# Patient Record
Sex: Female | Born: 1980 | Race: White | Hispanic: No | Marital: Married | Smoking: Never smoker
Health system: Southern US, Community
[De-identification: ages and names within clinical notes are randomized; demographics above are authoritative.]

## PROBLEM LIST (undated history)

## (undated) DIAGNOSIS — N3010 Interstitial cystitis (chronic) without hematuria: Secondary | ICD-10-CM

## (undated) DIAGNOSIS — E538 Deficiency of other specified B group vitamins: Secondary | ICD-10-CM

## (undated) HISTORY — PX: TUBAL LIGATION: SHX77

---

## 2014-04-01 ENCOUNTER — Inpatient Hospital Stay: Payer: Self-pay | Admitting: Psychiatry

## 2014-04-01 LAB — URINALYSIS, COMPLETE
Bacteria: NONE SEEN
Bilirubin,UR: NEGATIVE
Glucose,UR: NEGATIVE mg/dL (ref 0–75)
Nitrite: NEGATIVE
Ph: 6 (ref 4.5–8.0)
Protein: 30
RBC,UR: 114 /HPF (ref 0–5)
SPECIFIC GRAVITY: 1.024 (ref 1.003–1.030)
Squamous Epithelial: 1
Transitional Epi: 5
WBC UR: 49 /HPF (ref 0–5)

## 2014-04-01 LAB — COMPREHENSIVE METABOLIC PANEL
ALBUMIN: 4 g/dL (ref 3.4–5.0)
ALK PHOS: 119 U/L — AB
ALT: 35 U/L
Anion Gap: 8 (ref 7–16)
BILIRUBIN TOTAL: 0.8 mg/dL (ref 0.2–1.0)
BUN: 10 mg/dL (ref 7–18)
CALCIUM: 8.6 mg/dL (ref 8.5–10.1)
CHLORIDE: 106 mmol/L (ref 98–107)
Co2: 25 mmol/L (ref 21–32)
Creatinine: 0.75 mg/dL (ref 0.60–1.30)
EGFR (African American): >60
Glucose: 78 mg/dL (ref 65–99)
OSMOLALITY: 275 (ref 275–301)
Potassium: 3.6 mmol/L (ref 3.5–5.1)
SGOT(AST): 22 U/L (ref 15–37)
Sodium: 139 mmol/L (ref 136–145)
Total Protein: 7.4 g/dL (ref 6.4–8.2)

## 2014-04-01 LAB — CBC
HCT: 46.5 % (ref 35.0–47.0)
HGB: 15.4 g/dL (ref 12.0–16.0)
MCH: 31.2 pg (ref 26.0–34.0)
MCHC: 33.1 g/dL (ref 32.0–36.0)
MCV: 94 fL (ref 80–100)
PLATELETS: 383 10*3/uL (ref 150–440)
RBC: 4.94 10*6/uL (ref 3.80–5.20)
RDW: 14.2 % (ref 11.5–14.5)
WBC: 9 10*3/uL (ref 3.6–11.0)

## 2014-04-01 LAB — ACETAMINOPHEN LEVEL

## 2014-04-01 LAB — PREGNANCY, URINE: Pregnancy Test, Urine: NEGATIVE m[IU]/mL

## 2014-04-01 LAB — DRUG SCREEN, URINE

## 2014-04-01 LAB — SALICYLATE LEVEL: Salicylates, Serum: <1.7 mg/dL

## 2014-04-01 LAB — ETHANOL: Ethanol: <3 mg/dL

## 2014-04-06 LAB — URINE CULTURE

## 2014-10-19 NOTE — Consult Note (Signed)
PATIENT NAME:  Tracy Rice, Meda N MR#:  [id number] DATE OF BIRTH:  12-Sep-1980  DATE OF CONSULTATION:  04/01/2014  REFERRING PHYSICIAN:   CONSULTING PHYSICIAN:  Audery AmelJohn T. Emersen Carroll, MD  IDENTIFYING INFORMATION AND REASON FOR CONSULTATION: This is a 34 year old woman with a history of anxiety who comes to the hospital with the chief complaint, "I have postpartum depression."  HISTORY OF PRESENT ILLNESS: Information obtained from the patient and the chart. The patient says that for about the last week she has been feeling very depressed. Mood is down and sad. She is not eating well, not sleeping well and has lost weight. She is starting to have suicidal thoughts over the last day. Last night had thoughts about overdosing on medicine, this morning had thoughts about throwing herself off a bridge. She realizes that this is abnormal and has come in for help. She just gave birth about 2 weeks ago. Symptoms have been coming on since then. The depressive symptoms were not present before the birth. The patient says that the baby is healthy and that she is getting along fine with her husband. Nothing more than the routine expected stresses identified. Not abusing any substances. Also reports that she is having panic attacks. Although she has had panic attacks in the past, these are much worse than her usual ones. She has continued on her usual prescription medicine for her anxiety disorder throughout this without stopping it.   PAST PSYCHIATRIC HISTORY: No prior hospitalizations. No prior suicide attempts. Has seen a doctor in [place] for chronic anxiety and been treated with Lexapro 20 mg a day. Previously helpful for panic attacks. She says she has had mood ups and downs, but never had depression anything like this before.   FAMILY HISTORY: Father had some kind of mental health problems. She is not sure what. No other known family history.   SUBSTANCE ABUSE HISTORY: Not drinking. Not abusing drugs. No past history  of alcohol or drug use.   SOCIAL HISTORY: The patient is married, has a child who is 35 years old and the baby who was just born 2 weeks ago. Relationship is good with her husband, but he does not understand depression and has not been adequately supporting, but she does not feel any irritation or anger at him.   REVIEW OF SYSTEMS: Depressed mood, tearfulness, sadness, low energy, not eating, not sleeping. Positive suicidal ideation. Denies hallucinations. No homicidal ideation. The rest of the full 9 category review of systems negative.   MENTAL STATUS EXAMINATION: Neatly groomed woman who looks her stated age, interviewed in the hospital. Eye contact decreased. Psychomotor activity slow. Speech quiet, decreased in amount, somewhat blocked. Thoughts lucid but slow. No evidence of delusions. Denies auditory or visual hallucinations. Endorses suicidal ideation without specific plan. No homicidal ideation. Could recall 3 out of 3 objects immediately and 2 out of 3 at three minutes. Alert and oriented x4. Normal fund of knowledge. Normal judgment and insight.   CURRENT MEDICATIONS: Lexapro 20 mg per day.   ALLERGIES: CIPROFLOXACIN, MACRODANTIN, PENICILLIN.   PHYSICAL EXAMINATION: GENERAL: The patient does not appear to be in any acute physical distress.  VITAL SIGNS: Blood pressure 165/106, respirations 20, pulse 72, temperature 98.   LABORATORY RESULTS: Pregnancy test negative. Acetaminophen, alcohol and salicylates all negative. Chemistry panel: Slightly elevated alkaline phosphatase at 119, nothing else remarkable. CBC normal. Urinalysis: 2+ leukocyte esterase, many red cells and white cells, 2+ ketones. Drug screen negative.   ASSESSMENT: This is a 34 year old woman  with what sounds like a major depression, severe, coming on postpartum. Recent suicidal ideation. Clearly needs hospitalization. Medically stable. Possible urinary tract infection.   TREATMENT PLAN: Admit to psychiatry. Fall,  elopement and suicide precautions. Engage her in groups. Continue the Lexapro right now. Ativan 2 mg q. 4, p.r.n. for anxiety attacks.   DIAGNOSIS, PRINCIPAL AND PRIMARY:  AXIS I: Major depression, severe, single.   SECONDARY DIAGNOSES: AXIS I: Panic disorder without agoraphobia.  AXIS II: Deferred.  AXIS III: Status post recent normal birth. Rule out urinary tract infection. ____________________________ Audery Amel, MD jtc:sb D: 04/01/2014 12:53:13 ET T: 04/01/2014 13:09:16 ET JOB#: [id number]  cc: Audery Amel, MD, <Dictator> Audery Amel MD ELECTRONICALLY SIGNED 04/08/2014 0:23

## 2014-10-19 NOTE — H&P (Signed)
PATIENT NAME:  Tracy Rice, Tracy Rice MR#:  [id number] DATE OF BIRTH:  17-Oct-1980  DATE OF ADMISSION:  04/01/2014  REFERRING PHYSICIAN: Emergency Room MD    ATTENDING PHYSICIAN: Kristine Linea, MD   IDENTIFYING DATA: Ms. Couse is a 34 year old female with a history of depression and anxiety.   CHIEF COMPLAINT:  Ms. Lecount has a history of depression and anxiety since high school. She started taking Paxil in 1999.  It was later switched to Lexapro.  In 2006, when she was pregnant with her first child, who is now 35 years old.  Her pregnancy and postpartum with the first child was uneventful.  She delivered a healthy baby boy by cesarean section 2 weeks ago.  She immediately started feeling increasingly depressed, unable to bond with her child, unable to attend  to the child, older or younger, with low energy, crying spells, insomnia, severe weight loss, anhedonia, and more recently, thoughts of hurting herself.  She was trying to rest a few days ago and was looking at Benadryl, that sometimes she uses for sleep and had a thought to take more Benadryl, not possibly to kill herself, but to go to sleep.  On the day of admission, she was driving her car on the bridge and had a striking thought  of driving off the bridge.  She drove herself to the hospital immediately.  The contributing factor possibly could have been addition of steroid following the cesarean section.  She was put on a steroid taper.  She does not know the exact name of the medications, but stopped the medicine before the taper was over yesterday. She did not like it.  She tolerated her cesarean very well, was discharged 2 days following delivery to home; did not require any pain medication except for this steroid taper.  She has been taking Lexapro 20 mg daily all along as instructed by OB/GYN.  She is really puzzled why her second pregnancy and delivery would be so different from the first one.   PAST PSYCHIATRIC HISTORY: As above.  On Paxil  and then Lexapro for years for depression, and mostly anxiety.  She never attempted suicide.  She has never been hospitalized. No problems with substances.  FAMILY PSYCHIATRIC HISTORY:  Mother with a history of alcoholism, sober now and anxiety.  Father from home the patient is estranged has a history of mental illness.   PAST MEDICAL HISTORY: She had hypertension during her pregnancy and 2 weeks ago delivered a baby through cesarean section.   ALLERGIES: ZITHROMAX, MACRODANTIN, PENICILLIN.   MEDICATIONS ON ADMISSION: Lexapro 20 mg daily.   SOCIAL HISTORY: She is employed and works at [employer] office at [employer].  The plan was that she would stay home with the baby for maybe 8 weeks as she cannot afford to stay at home any longer.  At the moment, her husband, her in-laws, and her mother are taking care of both of her children.   REVIEW OF SYSTEMS:  CONSTITUTIONAL: No fevers or chills. Positive for some weight changes.  EYES: No double or blurred vision.  ENT: No hearing loss.  RESPIRATORY: No shortness of breath or cough.  CARDIOVASCULAR: No chest pain or orthopnea.  GASTROINTESTINAL: No abdominal pain, nausea, vomiting, or diarrhea.  GENITOURINARY: No incontinence or frequency.  ENDOCRINE: No heat or cold intolerance.  LYMPHATIC: No anemia or easy bruising.  INTEGUMENTARY: No acne or rash.  MUSCULOSKELETAL: No muscle or joint pain.  NEUROLOGIC: No tingling or weakness.  PSYCHIATRIC: See history of  present illness for details.   PHYSICAL EXAMINATION:  VITAL SIGNS: Blood pressure 122/89, pulse 99, respirations 16, temperature 98.3.  GENERAL: This is a well-developed young female in no acute distress.  HEENT: The pupils are equal, round, and reactive to light. Sclerae are anicteric.  NECK: Supple. No thyromegaly.  LUNGS: Clear to auscultation. No dullness to percussion.  HEART: Regular rhythm and rate. No murmurs, rubs, or gallops.  ABDOMEN: Soft, nontender, nondistended. Positive bowel  sounds.  MUSCULOSKELETAL: Normal muscle strength in all extremities.  SKIN: No rashes or bruises.  LYMPHATIC: No cervical adenopathy.  NEUROLOGIC: Cranial nerves II through XII are intact.   LABORATORY DATA: Chemistries are within normal limits. Blood alcohol level is zero. LFTs within normal limits with alkaline phosphatase of 119. Urine tox screen negative for substances. CBC within normal limits. Urinalysis is suggestive of urinary tract infection, leukocyte esterase 2+ and 49 white cells per field.  Serum acetaminophen and salicylates are low. Urine pregnancy test is negative.   MENTAL STATUS EXAMINATION ON ADMISSION:  The patient is alert and oriented to person, place, time and situation. She is pleasant, polite and cooperative. She is well groomed and casually dressed. She maintains good eye contact. Her speech is of normal rhythm, rate and volume. Mood is depressed with flat affect.  Thought process is logical and goal oriented. Thought content: She denies thoughts of hurting herself or others, but came to the hospital with a strong urge to drive off the bridge.  There are no delusions or paranoia.  There are no auditory or visual hallucinations.  Her cognition is grossly intact.  Registration, recall, short and long-term memory are intact. She is of above average intelligence and fund of knowledge.  Her insight and judgment are fair.    SUICIDE RISK ASSESSMENT ON ADMISSION: This is a patient with a long history of depression and anxiety who became acutely suicidal in the context of recent delivery and treatment with steroids.    INITIAL DIAGNOSES:  AXIS I: Major depressive disorder, postpartum onset.  AXIS II: Deferred.  AXIS III: Urinary tract infection.    PLAN: The patient was admitted to [facility] unit for safety, stabilization and medication management.  She was initially placed on suicide precautions and was closely monitored for any unsafe  behaviors.  She underwent full psychiatric and risk assessment.  She received pharmacotherapy, individual and group psychotherapy, substance abuse counseling, and support from therapeutic milieu.   1.  Suicidal ideation, she is able to contract for safety.  2.  We will add Abilify to her Lexapro.  3.  Insomnia: She slept well with Ativan last night.  We will give 1 mg tonight.    DISPOSITION: She will return to home.   FOLLOW UP:  Hopefully with [facility],      ____________________________ Ellin GoodieJolanta B. Jennet MaduroPucilowska, MD jbp:DT D: 04/02/2014 14:32:39 ET T: 04/02/2014 15:28:00 ET JOB#: [id number]  cc: Hewitt Garner B. Jennet MaduroPucilowska, MD, <Dictator> Shari ProwsJOLANTA B Jovonni Borquez MD ELECTRONICALLY SIGNED 04/04/2014 0:54

## 2015-06-29 ENCOUNTER — Emergency Department: Payer: BC Managed Care – PPO

## 2015-06-29 ENCOUNTER — Emergency Department
Admission: EM | Admit: 2015-06-29 | Discharge: 2015-06-30 | Disposition: A | Discharge status: Home / Self Care | Payer: BC Managed Care – PPO | Attending: Student | Admitting: Student

## 2015-06-29 DIAGNOSIS — Y92009 Unspecified place in unspecified non-institutional (private) residence as the place of occurrence of the external cause: Secondary | ICD-10-CM | POA: Diagnosis not present

## 2015-06-29 DIAGNOSIS — X500XXA Overexertion from strenuous movement or load, initial encounter: Secondary | ICD-10-CM | POA: Insufficient documentation

## 2015-06-29 DIAGNOSIS — Z3202 Encounter for pregnancy test, result negative: Secondary | ICD-10-CM | POA: Insufficient documentation

## 2015-06-29 DIAGNOSIS — Y998 Other external cause status: Secondary | ICD-10-CM | POA: Diagnosis not present

## 2015-06-29 DIAGNOSIS — Y9389 Activity, other specified: Secondary | ICD-10-CM | POA: Diagnosis not present

## 2015-06-29 DIAGNOSIS — S79922A Unspecified injury of left thigh, initial encounter: Secondary | ICD-10-CM | POA: Insufficient documentation

## 2015-06-29 DIAGNOSIS — S39012A Strain of muscle, fascia and tendon of lower back, initial encounter: Secondary | ICD-10-CM

## 2015-06-29 DIAGNOSIS — S3992XA Unspecified injury of lower back, initial encounter: Secondary | ICD-10-CM | POA: Diagnosis present

## 2015-06-29 LAB — URINALYSIS COMPLETE WITH MICROSCOPIC (ARMC ONLY)
BILIRUBIN URINE: NEGATIVE
Glucose, UA: NEGATIVE mg/dL
Hgb urine dipstick: NEGATIVE
KETONES UR: NEGATIVE mg/dL
NITRITE: NEGATIVE
PH: 6 (ref 5.0–8.0)
Protein, ur: NEGATIVE mg/dL
SPECIFIC GRAVITY, URINE: 1.003 — AB (ref 1.005–1.030)

## 2015-06-29 LAB — PREGNANCY, URINE: PREG TEST UR: NEGATIVE

## 2015-06-29 MED ORDER — CYCLOBENZAPRINE HCL 10 MG PO TABS
10.0000 mg | ORAL_TABLET | Freq: Once | ORAL | Status: AC
  Administered 2015-06-29: 10 mg via ORAL
  Filled 2015-06-29: qty 1

## 2015-06-29 NOTE — ED Notes (Signed)
Pt reports lifting her 9815 month old son and twisting, when she did she felt a popping sensation in her lower back with pain radiating down her left leg, pt had difficulty getting from the floor and ems was called. Pt denies bowel or bladder incontinence, state that her left leg feels tingly at this time. Pt also mentions pale colored blood on toilet paper when she wiped.

## 2015-06-29 NOTE — ED Notes (Signed)
Pt reports hearing back pop when lifting her son, reports numbness/tingling and weakness on left side.  Pt reports lower back pain.  Pt NAD at this time.

## 2015-06-29 NOTE — ED Provider Notes (Signed)
[facility] Emergency Department Provider Note ____________________________________________  Time seen: 2235  I have reviewed the triage vital signs and the nursing notes.  HISTORY  Chief Complaint  Back Pain  HPI Tracy Rice is a 35 y.o. female reports to the ED via EMS, for evaluation of sudden onset of left low back pain.She describes she was at home about 3 hours ago, and bent over at the waist to pick up her 75 pound, 63-month-old toddler. She describes immediate pain to the left lower back at the buttocks, that cause her to fall to her knees. She was unable to stand up and her husband was unable to get her to her feet. She describes a tingly feeling in her legs at this time. She is here via EMS for valuation of her symptoms. She denies a history of back pain, sciatica, or arthritis. She describes the pain to the thigh as throbbing in nature. She denies any bladder or bowel incontinence, or any foot drop. She describes the pain at a 7/10 with attempts to transition from one position to another, but denies any significant pain at rest.  No past medical history on file.  There are no active problems to display for this patient.  No past surgical history on file.  Current Outpatient Rx  Name  Route  Sig  Dispense  Refill  . cyclobenzaprine (FLEXERIL) 5 MG tablet   Oral   Take 1 tablet (5 mg total) by mouth 3 (three) times daily as needed for muscle spasms.   15 tablet   0   . ketorolac (TORADOL) 10 MG tablet   Oral   Take 1 tablet (10 mg total) by mouth every 8 (eight) hours.   15 tablet   0     Allergies Betadine  No family history on file.  Social History Social History  Substance Use Topics  . Smoking status: Not on file  . Smokeless tobacco: Not on file  . Alcohol Use: Not on file   Review of Systems  Constitutional: Negative for fever. Eyes: Negative for visual changes. ENT: Negative for sore throat. Cardiovascular: Negative for  chest pain. Respiratory: Negative for shortness of breath. Gastrointestinal: Negative for abdominal pain, vomiting and diarrhea. Genitourinary: Negative for dysuria. Musculoskeletal: Positive for back pain. Skin: Negative for rash. Neurological: Negative for headaches, focal weakness or numbness. ____________________________________________  PHYSICAL EXAM:  VITAL SIGNS: ED Triage Vitals  Enc Vitals Group     BP 06/29/15 2155 109/72 mmHg     Pulse Rate 06/29/15 2155 90     Resp 06/29/15 2155 18     Temp 06/29/15 2155 98.8 F (37.1 C)     Temp Source 06/29/15 2155 Oral     SpO2 06/29/15 2155 97 %     Weight 06/29/15 2155 180 lb (81.647 kg)     Height 06/29/15 2155 5\' 4"  (1.626 m)     Head Cir --      Peak Flow --      Pain Score 06/29/15 2156 7     Pain Loc --      Pain Edu? --      Excl. in GC? --    Constitutional: Alert and oriented. Well appearing and in no distress. Head: Normocephalic and atraumatic.      Eyes: Conjunctivae are normal. PERRL. Normal extraocular movements      Ears: Canals clear. TMs intact bilaterally.   Nose: No congestion/rhinorrhea.   Mouth/Throat: Mucous membranes are moist.  Neck: Supple. No thyromegaly. Hematological/Lymphatic/Immunological: No cervical lymphadenopathy. Cardiovascular: Normal rate, regular rhythm.  Respiratory: Normal respiratory effort. No wheezes/rales/rhonchi. Gastrointestinal: Soft and nontender. No distention. Musculoskeletal: Patient with slow transition from supine to sit. She said to palpation over the left lateral sacral region and into the lateral aspect of the thigh. She is able to demonstrate normal flexion and abduction of the left hip. Negative straight leg raise on exam. Nontender with normal range of motion in all extremities.  Neurologic:  Cranial nerves II through XII grossly intact. Normal LE DTRs bilaterally. Normal toe dorsiflexion and plantar flexion on exam. Normal gait without ataxia. Normal speech  and language. No gross focal neurologic deficits are appreciated. Skin:  Skin is warm, dry and intact. No rash noted. Psychiatric: Mood and affect are normal. Patient exhibits appropriate insight and judgment. ____________________________________________   LABS (pertinent positives/negatives) Labs Reviewed  URINALYSIS COMPLETEWITH MICROSCOPIC ([facility] ONLY) - Abnormal; Notable for the following:    Color, Urine STRAW (*)    APPearance CLEAR (*)    Specific Gravity, Urine 1.003 (*)    Leukocytes, UA TRACE (*)    Bacteria, UA RARE (*)    Squamous Epithelial / LPF 0-5 (*)    All other components within normal limits  PREGNANCY, URINE  ____________________________________________   RADIOLOGY  LS Spine IMPRESSION: No fracture or acute osseous abnormality.  I, Anise Harbin, Charlesetta IvoryJenise V Bacon, personally viewed and evaluated these images (plain radiographs) as part of my medical decision making, as well as reviewing the written report by the radiologist. ____________________________________________  PROCEDURES  Flexeril 10 mg PO Toradol 60 mg IM ____________________________________________  INITIAL IMPRESSION / ASSESSMENT AND PLAN / ED COURSE  Acute lumbar strain without radiologic evidence of underlying degenerative disc disease. Patient is discharged with a prescription for ketorolac and Flexeril dose as needed.  She'll apply ice and follow [facility] a primary care provider or [facility] as needed for ongoing symptoms. ____________________________________________  FINAL CLINICAL IMPRESSION(S) / ED DIAGNOSES  Final diagnoses:  Lumbar strain, initial encounter      Lissa HoardJenise V Bacon Jaxan Michel, PA-C 06/30/15 0014  Gayla DossEryka A Gayle, MD 07/02/15 2105

## 2015-06-30 MED ORDER — KETOROLAC TROMETHAMINE 60 MG/2ML IM SOLN
60.0000 mg | Freq: Once | INTRAMUSCULAR | Status: AC
  Administered 2015-06-30: 60 mg via INTRAMUSCULAR
  Filled 2015-06-30: qty 2

## 2015-06-30 MED ORDER — KETOROLAC TROMETHAMINE 10 MG PO TABS: 10.0000 mg | ORAL_TABLET | Freq: Three times a day (TID) | ORAL | Status: DC

## 2015-06-30 MED ORDER — CYCLOBENZAPRINE HCL 5 MG PO TABS: 5.0000 mg | ORAL_TABLET | Freq: Three times a day (TID) | ORAL | Status: DC | PRN

## 2015-06-30 NOTE — Discharge Instructions (Signed)
Lumbosacral Strain Lumbosacral strain is a strain of any of the parts that make up your lumbosacral vertebrae. Your lumbosacral vertebrae are the bones that make up the lower third of your backbone. Your lumbosacral vertebrae are held together by muscles and tough, fibrous tissue (ligaments).  CAUSES  A sudden blow to your back can cause lumbosacral strain. Also, anything that causes an excessive stretch of the muscles in the low back can cause this strain. This is typically seen when people exert themselves strenuously, fall, lift heavy objects, bend, or crouch repeatedly. RISK FACTORS  Physically demanding work.  Participation in pushing or pulling sports or sports that require a sudden twist of the back (tennis, golf, baseball).  Weight lifting.  Excessive lower back curvature.  Forward-tilted pelvis.  Weak back or abdominal muscles or both.  Tight hamstrings. SIGNS AND SYMPTOMS  Lumbosacral strain may cause pain in the area of your injury or pain that moves (radiates) down your leg.  DIAGNOSIS Your health care provider can often diagnose lumbosacral strain through a physical exam. In some cases, you may need tests such as X-ray exams.  TREATMENT  Treatment for your lower back injury depends on many factors that your clinician will have to evaluate. However, most treatment will include the use of anti-inflammatory medicines. HOME CARE INSTRUCTIONS   Avoid hard physical activities (tennis, racquetball, waterskiing) if you are not in proper physical condition for it. This may aggravate or create problems.  If you have a back problem, avoid sports requiring sudden body movements. Swimming and walking are generally safer activities.  Maintain good posture.  Maintain a healthy weight.  For acute conditions, you may put ice on the injured area.  Put ice in a plastic bag.  Place a towel between your skin and the bag.  Leave the ice on for 20 minutes, 2-3 times a day.  When the  low back starts healing, stretching and strengthening exercises may be recommended. SEEK MEDICAL CARE IF:  Your back pain is getting worse.  You experience severe back pain not relieved with medicines. SEEK IMMEDIATE MEDICAL CARE IF:   You have numbness, tingling, weakness, or problems with the use of your arms or legs.  There is a change in bowel or bladder control.  You have increasing pain in any area of the body, including your belly (abdomen).  You notice shortness of breath, dizziness, or feel faint.  You feel sick to your stomach (nauseous), are throwing up (vomiting), or become sweaty.  You notice discoloration of your toes or legs, or your feet get very cold. MAKE SURE YOU:   Understand these instructions.  Will watch your condition.  Will get help right away if you are not doing well or get worse.   This information is not intended to replace advice given to you by your health care provider. Make sure you discuss any questions you have with your health care provider.   Document Released: 03/24/2005 Document Revised: 07/05/2014 Document Reviewed: 01/31/2013 Elsevier Interactive Patient Education [employer]   Your exam and x-ray support a diagnosis of an acute lumbar muscle strain. Take the prescription meds as directed. [facility] your provider or [facility] as needed. Apply ice to reduce symptoms. Change positions often, and exercise caution when picking up your toddler.

## 2016-07-20 ENCOUNTER — Ambulatory Visit
Admission: EM | Admit: 2016-07-20 | Discharge: 2016-07-20 | Disposition: A | Discharge status: Home / Self Care | Payer: BC Managed Care – PPO | Attending: Family Medicine | Admitting: Family Medicine

## 2016-07-20 ENCOUNTER — Encounter: Payer: Self-pay | Admitting: Emergency Medicine

## 2016-07-20 DIAGNOSIS — R300 Dysuria: Secondary | ICD-10-CM | POA: Diagnosis not present

## 2016-07-20 DIAGNOSIS — N760 Acute vaginitis: Secondary | ICD-10-CM

## 2016-07-20 DIAGNOSIS — B9689 Other specified bacterial agents as the cause of diseases classified elsewhere: Secondary | ICD-10-CM | POA: Diagnosis not present

## 2016-07-20 LAB — WET PREP, GENITAL
SPERM: NONE SEEN
Trich, Wet Prep: NONE SEEN
Yeast Wet Prep HPF POC: NONE SEEN

## 2016-07-20 LAB — URINALYSIS, COMPLETE (UACMP) WITH MICROSCOPIC
Bacteria, UA: NONE SEEN
Bilirubin Urine: NEGATIVE
GLUCOSE, UA: NEGATIVE mg/dL
HGB URINE DIPSTICK: NEGATIVE
Ketones, ur: NEGATIVE mg/dL
Leukocytes, UA: NEGATIVE
Nitrite: NEGATIVE
Protein, ur: NEGATIVE mg/dL
WBC, UA: NONE SEEN WBC/hpf (ref 0–5)
pH: 7 (ref 5.0–8.0)

## 2016-07-20 MED ORDER — METRONIDAZOLE 500 MG PO TABS: 500.0000 mg | ORAL_TABLET | Freq: Two times a day (BID) | ORAL | 0 refills | Status: DC

## 2016-07-20 MED ORDER — PHENAZOPYRIDINE HCL 200 MG PO TABS: 200.0000 mg | ORAL_TABLET | Freq: Three times a day (TID) | ORAL | 0 refills | Status: DC | PRN

## 2016-07-20 MED ORDER — FLUCONAZOLE 150 MG PO TABS: 150.0000 mg | ORAL_TABLET | Freq: Once | ORAL | 0 refills | Status: AC

## 2016-07-20 NOTE — ED Triage Notes (Signed)
Patient c/o burning when urinating for the past 2 days.  

## 2016-07-20 NOTE — ED Provider Notes (Signed)
[facility]    CSN: [id number] Arrival date & time: 07/20/16  1901     History   Chief Complaint Chief Complaint  Patient presents with  . Dysuria    HPI Tracy Rice is a 36 y.o. female.   Patient symptoms burning urination for the last 2 days. She states she has interstitial cystis and she thinks of burning urination she's having now is coming from interstitial cystitis. However she also reports having a bacterial vaginosis infection the past and she has a discharge. She does not know whether the dysuria is coming from the bacterial vaginosis or from interstitial cystitis. She states that he's noticed blood in the urine but that she's been drinking a lot of water and that she's had diluted specimens before that did not show actual infection and it was Bactrim infection. However she does have to admit that should the discharge has been quite intense. She progressed whether the bacterial vaginosis could be causing her symptoms which may well be. Explained with the wet prep shows yeast will treat with probably a different antibiotic to Macrobid since pH of the urine is 7 but if bacterial vaginosis is seen about the bacterial vaginosis culture urine and await CT results of the urine. Surgeries positive for C-section tubal ligation. No pertinent family medical history. She's never smoked before. She is allergic to Betadine and sulfur. No pertinent family medical history relevant to today's visit.   The history is provided by the patient. No language interpreter was used.  Vaginal Discharge  Quality:  Malodorous, thick and white Severity:  Moderate Onset quality:  Sudden Timing:  Constant Progression:  Worsening Chronicity:  Recurrent Relieved by:  Nothing Worsened by:  Nothing Ineffective treatments:  None tried Associated symptoms: dysuria   Dysuria:    Severity:  Moderate   Onset quality:  Sudden   Duration:  2 days   Timing:  Constant   Progression:  Worsening  Chronicity:  Recurrent Risk factors: unprotected sex   Risk factors: no new sexual partner and no STI exposure     History reviewed. No pertinent past medical history.  There are no active problems to display for this patient.   Past Surgical History:  Procedure Laterality Date  . CESAREAN SECTION    . TUBAL LIGATION      OB History    No data available       Home Medications    Prior to Admission medications   Medication Sig Start Date End Date Taking? Authorizing Provider  traZODone (DESYREL) 100 MG tablet Take 100 mg by mouth at bedtime.   Yes Historical Provider, MD  venlafaxine (EFFEXOR) 37.5 MG tablet Take 37.5 mg by mouth daily.   Yes Historical Provider, MD  venlafaxine (EFFEXOR) 75 MG tablet Take 75 mg by mouth daily.   Yes Historical Provider, MD  fluconazole (DIFLUCAN) 150 MG tablet Take 1 tablet (150 mg total) by mouth once. 07/20/16 07/20/16  Hassan Rowan, MD  metroNIDAZOLE (FLAGYL) 500 MG tablet Take 1 tablet (500 mg total) by mouth 2 (two) times daily. 07/20/16   Hassan Rowan, MD  phenazopyridine (PYRIDIUM) 200 MG tablet Take 1 tablet (200 mg total) by mouth 3 (three) times daily as needed for pain. 07/20/16   Hassan Rowan, MD    Family History History reviewed. No pertinent family history.  Social History Social History  Substance Use Topics  . Smoking status: Never Smoker  . Smokeless tobacco: Never Used  . Alcohol use No  Allergies   Betadine [povidone iodine] and Sulfa antibiotics   Review of Systems Review of Systems  Constitutional: Negative for activity change.  HENT: Negative.   Genitourinary: Positive for decreased urine volume, dysuria, urgency and vaginal discharge. Negative for pelvic pain and vaginal pain.  Musculoskeletal: Negative.   All other systems reviewed and are negative.    Physical Exam Triage Vital Signs ED Triage Vitals  Enc Vitals Group     BP 07/20/16 2016 118/77     Pulse Rate 07/20/16 2016 85     Resp 07/20/16  2016 16     Temp 07/20/16 2016 98.4 F (36.9 C)     Temp Source 07/20/16 2016 Oral     SpO2 07/20/16 2016 100 %     Weight 07/20/16 2013 180 lb (81.6 kg)     Height 07/20/16 2013 5\' 4"  (1.626 m)     Head Circumference --      Peak Flow --      Pain Score 07/20/16 2016 0     Pain Loc --      Pain Edu? --      Excl. in GC? --    No data found.   Updated Vital Signs BP 118/77 (BP Location: Left Arm)   Pulse 85   Temp 98.4 F (36.9 C) (Oral)   Resp 16   Ht 5\' 4"  (1.626 m)   Wt 180 lb (81.6 kg)   LMP 07/13/2016 (Approximate)   SpO2 100%   BMI 30.90 kg/m   Visual Acuity Right Eye Distance:   Left Eye Distance:   Bilateral Distance:    Right Eye Near:   Left Eye Near:    Bilateral Near:     Physical Exam  Constitutional: She is oriented to person, place, and time. She appears well-developed and well-nourished. No distress.  HENT:  Head: Normocephalic and atraumatic.  Right Ear: External ear normal.  Left Ear: External ear normal.  Eyes: Conjunctivae are normal. Pupils are equal, round, and reactive to light. Right eye exhibits no discharge. Left eye exhibits no discharge.  Neck: Normal range of motion. Neck supple. No tracheal deviation present. No thyromegaly present.  Pulmonary/Chest: Breath sounds normal.  Abdominal: Hernia confirmed negative in the right inguinal area and confirmed negative in the left inguinal area.  Genitourinary: Rectum normal. Rectal exam shows no fissure. There is no rash, tenderness or lesion on the right labia. There is no rash, tenderness or lesion on the left labia. Uterus is tender. Cervix exhibits no discharge. Right adnexum displays no mass and no tenderness. Left adnexum displays no mass and no tenderness. No erythema, tenderness or bleeding in the vagina. No foreign body in the vagina. Vaginal discharge found.  Musculoskeletal: Normal range of motion. She exhibits no edema or deformity.  Lymphadenopathy:    She has no cervical  adenopathy.       Right: No inguinal adenopathy present.  Neurological: She is alert and oriented to person, place, and time.  Skin: Skin is warm. She is not diaphoretic.  Psychiatric: She has a normal mood and affect.  Vitals reviewed.    UC Treatments / Results  Labs (all labs ordered are listed, but only abnormal results are displayed) Labs Reviewed  WET PREP, GENITAL - Abnormal; Notable for the following:       Result Value   Clue Cells Wet Prep HPF POC PRESENT (*)    WBC, Wet Prep HPF POC FEW (*)    All other components within  normal limits  URINALYSIS, COMPLETE (UACMP) WITH MICROSCOPIC - Abnormal; Notable for the following:    Specific Gravity, Urine <1.005 (*)    Squamous Epithelial / LPF 0-5 (*)    All other components within normal limits  URINE CULTURE  CHLAMYDIA/NGC RT PCR [facility] ONLY)    EKG  EKG Interpretation None       Radiology No results found.  Procedures Procedures (including critical care time)  Medications Ordered in UC Medications - No data to display  Results for orders placed or performed during the hospital encounter of 07/20/16  Wet prep, genital  Result Value Ref Range   Yeast Wet Prep HPF POC NONE SEEN NONE SEEN   Trich, Wet Prep NONE SEEN NONE SEEN   Clue Cells Wet Prep HPF POC PRESENT (A) NONE SEEN   WBC, Wet Prep HPF POC FEW (A) NONE SEEN   Sperm NONE SEEN   Urinalysis, Complete w Microscopic  Result Value Ref Range   Color, Urine YELLOW YELLOW   APPearance CLEAR CLEAR   Specific Gravity, Urine <1.005 (L) 1.005 - 1.030   pH 7.0 5.0 - 8.0   Glucose, UA NEGATIVE NEGATIVE mg/dL   Hgb urine dipstick NEGATIVE NEGATIVE   Bilirubin Urine NEGATIVE NEGATIVE   Ketones, ur NEGATIVE NEGATIVE mg/dL   Protein, ur NEGATIVE NEGATIVE mg/dL   Nitrite NEGATIVE NEGATIVE   Leukocytes, UA NEGATIVE NEGATIVE   Squamous Epithelial / LPF 0-5 (A) NONE SEEN   WBC, UA NONE SEEN 0 - 5 WBC/hpf   RBC / HPF 0-5 0 - 5 RBC/hpf   Bacteria, UA NONE  SEEN NONE SEEN   Initial Impression / Assessment and Plan / UC Course  I have reviewed the triage vital signs and the nursing notes.  Pertinent labs & imaging results that were available during my care of the patient were reviewed by me and considered in my medical decision making (see chart for details).   patient urine as mentioned above since the remarkable urine culture was ordered the wet prep shows bacterial vaginosis present will treat with Flagyl 500 mg 1 tablet twice a day for 10 days will also place on Pyridium since she was having the symptoms of dysuria and an ossicle place on Diflucan to take for yeast. Follow-up PCP in 1-2 weeks as needed. Also discussed patient since she states that she skips bacterial vaginosis infection multiple times a year as well as interstitial cystitis flareup it might be prudent for her husband to be checked for bacterial vaginosis explained to her really talk about her coming in with her husband to either here were him working or her PCP explained to them that she keeps get a bacterial vaginosis in requesting for husband to be treated as well. As I told her she would be probably check for GC chlamydia and they would go from there.    Final Clinical Impressions(s) / UC Diagnoses   Final diagnoses:  Bacterial vaginosis  Dysuria    New Prescriptions Discharge Medication List as of 07/20/2016 10:05 PM    START taking these medications   Details  metroNIDAZOLE (FLAGYL) 500 MG tablet Take 1 tablet (500 mg total) by mouth 2 (two) times daily., Starting Tue 07/20/2016, Normal    phenazopyridine (PYRIDIUM) 200 MG tablet Take 1 tablet (200 mg total) by mouth 3 (three) times daily as needed for pain., Starting Tue 07/20/2016, Normal        Note: This dictation was prepared with Dragon dictation along with smaller phrase technology.  Any transcriptional errors that result from this process are unintentional.   Hassan Rowan, MD 07/20/16 2217

## 2016-07-21 LAB — CHLAMYDIA/NGC RT PCR (ARMC ONLY)
Chlamydia Tr: NOT DETECTED
N GONORRHOEAE: NOT DETECTED

## 2016-07-22 LAB — URINE CULTURE

## 2019-11-13 ENCOUNTER — Encounter: Payer: Self-pay | Admitting: Emergency Medicine

## 2019-11-13 ENCOUNTER — Other Ambulatory Visit: Payer: Self-pay

## 2019-11-13 ENCOUNTER — Emergency Department
Admission: EM | Admit: 2019-11-13 | Discharge: 2019-11-13 | Disposition: A | Discharge status: Home / Self Care | Payer: BC Managed Care – PPO | Attending: Emergency Medicine | Admitting: Emergency Medicine

## 2019-11-13 DIAGNOSIS — H8110 Benign paroxysmal vertigo, unspecified ear: Secondary | ICD-10-CM | POA: Diagnosis not present

## 2019-11-13 DIAGNOSIS — R42 Dizziness and giddiness: Secondary | ICD-10-CM | POA: Diagnosis present

## 2019-11-13 DIAGNOSIS — Z79899 Other long term (current) drug therapy: Secondary | ICD-10-CM | POA: Insufficient documentation

## 2019-11-13 LAB — URINALYSIS, COMPLETE (UACMP) WITH MICROSCOPIC
Bilirubin Urine: NEGATIVE
Glucose, UA: NEGATIVE mg/dL
Ketones, ur: NEGATIVE mg/dL
Leukocytes,Ua: NEGATIVE
Nitrite: NEGATIVE
Protein, ur: 30 mg/dL — AB
Specific Gravity, Urine: 1.003 — ABNORMAL LOW (ref 1.005–1.030)
pH: 7 (ref 5.0–8.0)

## 2019-11-13 LAB — CBC
HCT: 39 % (ref 36.0–46.0)
Hemoglobin: 12.8 g/dL (ref 12.0–15.0)
MCH: 30.8 pg (ref 26.0–34.0)
MCHC: 32.8 g/dL (ref 30.0–36.0)
MCV: 94 fL (ref 80.0–100.0)
Platelets: 350 10*3/uL (ref 150–400)
RBC: 4.15 MIL/uL (ref 3.87–5.11)
RDW: 13.3 % (ref 11.5–15.5)
WBC: 6.6 10*3/uL (ref 4.0–10.5)
nRBC: 0 % (ref 0.0–0.2)

## 2019-11-13 LAB — BASIC METABOLIC PANEL
Anion gap: 5 (ref 5–15)
BUN: 7 mg/dL (ref 6–20)
CO2: 28 mmol/L (ref 22–32)
Calcium: 8.8 mg/dL — ABNORMAL LOW (ref 8.9–10.3)
Chloride: 107 mmol/L (ref 98–111)
Creatinine, Ser: 0.58 mg/dL (ref 0.44–1.00)
GFR calc Af Amer: >60 mL/min (ref >60)
GFR calc non Af Amer: >60 mL/min (ref >60)
Glucose, Bld: 89 mg/dL (ref 70–99)
Potassium: 4 mmol/L (ref 3.5–5.1)
Sodium: 140 mmol/L (ref 135–145)

## 2019-11-13 MED ORDER — DIPHENHYDRAMINE HCL 25 MG PO TABS: 25.0000 mg | ORAL_TABLET | ORAL | 0 refills | Status: DC | PRN

## 2019-11-13 MED ORDER — SODIUM CHLORIDE 0.9% FLUSH: 3.0000 mL | Freq: Once | INTRAVENOUS | Status: DC

## 2019-11-13 MED ORDER — MECLIZINE HCL 50 MG PO TABS: 25.0000 mg | ORAL_TABLET | Freq: Four times a day (QID) | ORAL | 0 refills | Status: DC

## 2019-11-13 NOTE — ED Notes (Signed)
AAOx3.  Skin warm and dry.  NAD.  Ambulates with easy and steady gait.  MAE equally and strong.  States dizziness occurs when moving head from side to side.

## 2019-11-13 NOTE — Discharge Instructions (Signed)
Take meclizine 4 times daily. Take Benadryl every 4 hours.

## 2019-11-13 NOTE — ED Provider Notes (Signed)
Emergency Department Provider Note  ____________________________________________  Time seen: Approximately 3:25 PM  I have reviewed the triage vital signs and the nursing notes.   HISTORY  Chief Complaint Dizziness   Historian Patient    HPI Tracy Rice is a 39 y.o. female presents to the emergency department with dizziness that occurs when patient goes from a supine to sitting position or from a sitting position to standing.  Patient states that vertigo started 2 days ago.  She also states that vertigo can worsen when she turns her head in a certain direction.  She denies recent illness.  No falls or mechanisms of trauma.  No headache.  She is been afebrile at home.  She denies a history of vertigo in the past.  Patient states that she is currently menstruating but does not experience vertigo with her cycle.  She has been staying hydrated at home.  No other alleviating measures have been attempted.    History reviewed. No pertinent past medical history.   Immunizations up to date:  Yes.     History reviewed. No pertinent past medical history.  There are no problems to display for this patient.   Past Surgical History:  Procedure Laterality Date  . CESAREAN SECTION    . TUBAL LIGATION      Prior to Admission medications   Medication Sig Start Date End Date Taking? Authorizing Provider  diphenhydrAMINE (BENADRYL ALLERGY) 25 MG tablet Take 1 tablet (25 mg total) by mouth every 4 (four) hours as needed. 11/13/19   Orvil Feil, PA-C  meclizine (ANTIVERT) 50 MG tablet Take 0.5 tablets (25 mg total) by mouth in the morning, at noon, in the evening, and at bedtime. 11/13/19   Orvil Feil, PA-C  metroNIDAZOLE (FLAGYL) 500 MG tablet Take 1 tablet (500 mg total) by mouth 2 (two) times daily. 07/20/16   Hassan Rowan, MD  phenazopyridine (PYRIDIUM) 200 MG tablet Take 1 tablet (200 mg total) by mouth 3 (three) times daily as needed for pain. 07/20/16   Hassan Rowan, MD   traZODone (DESYREL) 100 MG tablet Take 100 mg by mouth at bedtime.    [provider]  venlafaxine (EFFEXOR) 37.5 MG tablet Take 37.5 mg by mouth daily.    [provider]  venlafaxine (EFFEXOR) 75 MG tablet Take 75 mg by mouth daily.    [provider]    Allergies Betadine [povidone iodine] and Sulfa antibiotics  History reviewed. No pertinent family history.  Social History Social History   Tobacco Use  . Smoking status: Never Smoker  . Smokeless tobacco: Never Used  Substance Use Topics  . Alcohol use: No  . Drug use: Not on file     Review of Systems  Constitutional: No fever/chills Eyes:  No discharge ENT: No upper respiratory complaints. Respiratory: no cough. No SOB/ use of accessory muscles to breath Gastrointestinal:   No nausea, no vomiting.  No diarrhea.  No constipation. Musculoskeletal: Negative for musculoskeletal pain. Skin: Negative for rash, abrasions, lacerations, ecchymosis.    ____________________________________________   PHYSICAL EXAM:  VITAL SIGNS: ED Triage Vitals  Enc Vitals Group     BP 11/13/19 1236 122/70     Pulse Rate 11/13/19 1236 66     Resp 11/13/19 1236 18     Temp 11/13/19 1236 98.5 F (36.9 C)     Temp Source 11/13/19 1236 Oral     SpO2 11/13/19 1236 100 %     Weight 11/13/19 1235 180 lb (81.6  kg)     Height 11/13/19 1235 5\' 4"  (1.626 m)     Head Circumference --      Peak Flow --      Pain Score 11/13/19 1235 0     Pain Loc --      Pain Edu? --      Excl. in [place]? --      Constitutional: Alert and oriented. Well appearing and in no acute distress. Eyes: Conjunctivae are normal. PERRL. EOMI. Head: Atraumatic. ENT:      Ears: TMs are effused bilaterally.      Nose: No congestion/rhinnorhea.      Mouth/Throat: Mucous membranes are moist.  Neck: No stridor.  No cervical spine tenderness to palpation. Cardiovascular: Normal rate, regular rhythm. Normal S1 and S2.  Good peripheral  circulation. Respiratory: Normal respiratory effort without tachypnea or retractions. Lungs CTAB. Good air entry to the bases with no decreased or absent breath sounds Gastrointestinal: Bowel sounds x 4 quadrants. Soft and nontender to palpation. No guarding or rigidity. No distention. Musculoskeletal: Full range of motion to all extremities. No obvious deformities noted Neurologic:  Normal for age. No gross focal neurologic deficits are appreciated.  Dizziness is reproduced when patient goes from a sitting to standing position. Skin:  Skin is warm, dry and intact. No rash noted. Psychiatric: Mood and affect are normal for age. Speech and behavior are normal.   ____________________________________________   LABS (all labs ordered are listed, but only abnormal results are displayed)  Labs Reviewed  BASIC METABOLIC PANEL - Abnormal; Notable for the following components:      Result Value   Calcium 8.8 (*)    All other components within normal limits  URINALYSIS, COMPLETE (UACMP) WITH MICROSCOPIC - Abnormal; Notable for the following components:   Color, Urine AMBER (*)    APPearance HAZY (*)    Specific Gravity, Urine 1.003 (*)    Hgb urine dipstick LARGE (*)    Protein, ur 30 (*)    Bacteria, UA RARE (*)    All other components within normal limits  CBC  CBG MONITORING, ED  POC URINE PREG, ED   ____________________________________________  EKG   ____________________________________________  RADIOLOGY   No results found.  ____________________________________________    PROCEDURES  Procedure(s) performed:     Procedures     Medications  sodium chloride flush (NS) 0.9 % injection 3 mL (has no administration in time range)     ____________________________________________   INITIAL IMPRESSION / ASSESSMENT AND PLAN / ED COURSE  Pertinent labs & imaging results that were available during my care of the patient were reviewed by me and considered in my medical  decision making (see chart for details).      Assessment and plan Benign positional vertigo 39 year old female presents to the emergency department with vertigo reproduced with a sitting to standing position over the past 2 days.  Vital signs were reviewed at triage and were reassuring.  Benign positional vertigo is likely diagnosis at this time.  Patient was treated with meclizine and Benadryl.  She was advised to follow-up with primary care if symptoms persist.  All patient questions were answered.    ____________________________________________  FINAL CLINICAL IMPRESSION(S) / ED DIAGNOSES  Final diagnoses:  Benign paroxysmal positional vertigo, unspecified laterality      NEW MEDICATIONS STARTED DURING THIS VISIT:  ED Discharge Orders         Ordered    meclizine (ANTIVERT) 50 MG tablet  4 times daily  11/13/19 1522    diphenhydrAMINE (BENADRYL ALLERGY) 25 MG tablet  Every 4 hours PRN     11/13/19 1522              This chart was dictated using voice recognition software/Dragon. Despite best efforts to proofread, errors can occur which can change the meaning. Any change was purely unintentional.     Orvil Feil, PA-C 11/13/19 1528    Phineas Semen, MD 11/13/19 [phone]

## 2019-11-13 NOTE — ED Notes (Signed)
AAOx3.  Skin warm and dry.  NAD 

## 2019-11-13 NOTE — ED Triage Notes (Signed)
Pt c/o "vertigo" x 2 days, pt states dizziness that is worse when she turns her head to the side. Pt ambulatory without difficulty to triage, neurologically intact in triage. Pt states " I feel like I'm going to fall out", while in triage.

## 2020-07-06 ENCOUNTER — Other Ambulatory Visit: Payer: Self-pay

## 2020-07-06 ENCOUNTER — Emergency Department
Admission: EM | Admit: 2020-07-06 | Discharge: 2020-07-06 | Disposition: A | Discharge status: Home / Self Care | Payer: BC Managed Care – PPO | Attending: Emergency Medicine | Admitting: Emergency Medicine

## 2020-07-06 ENCOUNTER — Emergency Department: Payer: BC Managed Care – PPO

## 2020-07-06 DIAGNOSIS — U071 COVID-19: Secondary | ICD-10-CM | POA: Insufficient documentation

## 2020-07-06 DIAGNOSIS — R079 Chest pain, unspecified: Secondary | ICD-10-CM | POA: Diagnosis present

## 2020-07-06 DIAGNOSIS — J1282 Pneumonia due to coronavirus disease 2019: Secondary | ICD-10-CM | POA: Insufficient documentation

## 2020-07-06 LAB — BASIC METABOLIC PANEL
Anion gap: 12 (ref 5–15)
BUN: 9 mg/dL (ref 6–20)
CO2: 24 mmol/L (ref 22–32)
Calcium: 9.4 mg/dL (ref 8.9–10.3)
Chloride: 102 mmol/L (ref 98–111)
Creatinine, Ser: 0.69 mg/dL (ref 0.44–1.00)
GFR, Estimated: >60 mL/min (ref >60)
Glucose, Bld: 90 mg/dL (ref 70–99)
Potassium: 4.2 mmol/L (ref 3.5–5.1)
Sodium: 138 mmol/L (ref 135–145)

## 2020-07-06 LAB — CBC
HCT: 42 % (ref 36.0–46.0)
Hemoglobin: 13.7 g/dL (ref 12.0–15.0)
MCH: 28.9 pg (ref 26.0–34.0)
MCHC: 32.6 g/dL (ref 30.0–36.0)
MCV: 88.6 fL (ref 80.0–100.0)
Platelets: 343 10*3/uL (ref 150–400)
RBC: 4.74 MIL/uL (ref 3.87–5.11)
RDW: 13.9 % (ref 11.5–15.5)
WBC: 6.5 10*3/uL (ref 4.0–10.5)
nRBC: 0 % (ref 0.0–0.2)

## 2020-07-06 LAB — TROPONIN I (HIGH SENSITIVITY): Troponin I (High Sensitivity): 3 ng/L (ref <18)

## 2020-07-06 MED ORDER — DEXAMETHASONE SODIUM PHOSPHATE 10 MG/ML IJ SOLN
10.0000 mg | Freq: Once | INTRAMUSCULAR | Status: AC
  Administered 2020-07-06: 10 mg via INTRAMUSCULAR
  Filled 2020-07-06: qty 1

## 2020-07-06 NOTE — ED Provider Notes (Signed)
[facility] Emergency Department Provider Note   ____________________________________________    I have reviewed the triage vital signs and the nursing notes.   HISTORY  Chief Complaint Chest Pain     HPI Tracy Rice is a 40 y.o. female who presents with complaints of chest discomfort.  Patient reports she has been feeling ill for the last 4 to 5 days, she does have COVID-19.  She reports she has been feeling quite right since Christmas time when her son was diagnosed with Covid.  She has been vaccinated last March with [employer] x2.  She complains of chest discomfort when sitting up primarily.  Shortness of breath with significant exertion.  History reviewed. No pertinent past medical history.  There are no problems to display for this patient.   Past Surgical History:  Procedure Laterality Date  . CESAREAN SECTION    . TUBAL LIGATION      Prior to Admission medications   Medication Sig Start Date End Date Taking? Authorizing Provider  diphenhydrAMINE (BENADRYL ALLERGY) 25 MG tablet Take 1 tablet (25 mg total) by mouth every 4 (four) hours as needed. 11/13/19   Orvil Feil, PA-C  meclizine (ANTIVERT) 50 MG tablet Take 0.5 tablets (25 mg total) by mouth in the morning, at noon, in the evening, and at bedtime. 11/13/19   Orvil Feil, PA-C  metroNIDAZOLE (FLAGYL) 500 MG tablet Take 1 tablet (500 mg total) by mouth 2 (two) times daily. 07/20/16   Hassan Rowan, MD  phenazopyridine (PYRIDIUM) 200 MG tablet Take 1 tablet (200 mg total) by mouth 3 (three) times daily as needed for pain. 07/20/16   Hassan Rowan, MD  traZODone (DESYREL) 100 MG tablet Take 100 mg by mouth at bedtime.    [provider]  venlafaxine (EFFEXOR) 37.5 MG tablet Take 37.5 mg by mouth daily.    [provider]  venlafaxine (EFFEXOR) 75 MG tablet Take 75 mg by mouth daily.    [provider]     Allergies Betadine [povidone iodine] and Sulfa  antibiotics  History reviewed. No pertinent family history.  Social History Social History   Tobacco Use  . Smoking status: Never Smoker  . Smokeless tobacco: Never Used  Substance Use Topics  . Alcohol use: No    Review of Systems  Constitutional: No fever/chills Eyes: No visual changes.  ENT: No sore throat. Cardiovascular: As above Respiratory: As above Gastrointestinal: No abdominal pain.  No nausea, no vomiting.   Genitourinary: Negative for dysuria. Musculoskeletal: Negative for back pain. Skin: Negative for rash. Neurological: Negative for headaches or weakness   ____________________________________________   PHYSICAL EXAM:  VITAL SIGNS: ED Triage Vitals  Enc Vitals Group     BP 07/06/20 2104 128/73     Pulse Rate 07/06/20 2104 90     Resp 07/06/20 2104 16     Temp 07/06/20 2104 98.2 F (36.8 C)     Temp Source 07/06/20 2104 Oral     SpO2 07/06/20 2104 98 %     Weight 07/06/20 2106 83 kg (183 lb)     Height 07/06/20 2106 1.626 m (5\' 4" )     Head Circumference --      Peak Flow --      Pain Score 07/06/20 2105 0     Pain Loc --      Pain Edu? --      Excl. in [place]? --     Constitutional: Alert and oriented.e  Nose:  No congestion/rhinnorhea. Mouth/Throat: Mucous membranes are moist.    Cardiovascular: Normal rate, regular rhythm. Grossly normal heart sounds.  Good peripheral circulation. Respiratory: Normal respiratory effort.  No retractions. Lungs CTAB. Gastrointestinal: Soft and nontender. No distention.  No CVA tenderness.  Musculoskeletal: No lower extremity tenderness nor edema.  Warm and well perfused Neurologic:  Normal speech and language. No gross focal neurologic deficits are appreciated.  Skin:  Skin is warm, dry and intact. No rash noted. Psychiatric: Mood and affect are normal. Speech and behavior are normal.  ____________________________________________   LABS (all labs ordered are listed, but only abnormal results are  displayed)  Labs Reviewed  BASIC METABOLIC PANEL  CBC  POC URINE PREG, ED  TROPONIN I (HIGH SENSITIVITY)  TROPONIN I (HIGH SENSITIVITY)   ____________________________________________  EKG  ED ECG REPORT I, Jene Every, the attending physician, personally viewed and interpreted this ECG.  Date: 07/06/2020  Rhythm: normal sinus rhythm QRS Axis: normal Intervals: normal ST/T Wave abnormalities: normal Narrative Interpretation: no evidence of acute ischemia  ____________________________________________  RADIOLOGY  Chest x-ray reviewed by me, mild infiltrate ____________________________________________   PROCEDURES  Procedure(s) performed: No  Procedures   Critical Care performed: No ____________________________________________   INITIAL IMPRESSION / ASSESSMENT AND PLAN / ED COURSE  Pertinent labs & imaging results that were available during my care of the patient were reviewed by me and considered in my medical decision making (see chart for details).  Patient with known COVID-19 presents with chest discomfort as above, EKG is reassuring, troponin is normal.  Chest x-ray demonstrates mild infiltrates consistent with Covid pneumonia.  Lab work is overall quite reassuring, normal BUN and creatinine, normal white blood cell count.  We will treat with IM Decadron, outpatient follow-up as needed, return precautions discussed    ____________________________________________   FINAL CLINICAL IMPRESSION(S) / ED DIAGNOSES  Final diagnoses:  Pneumonia due to COVID-19 virus        Note:  This document was prepared using Dragon voice recognition software and may include unintentional dictation errors.   Jene Every, MD 07/06/20 [phone]

## 2020-07-06 NOTE — ED Triage Notes (Signed)
Pt endorses chest pain, shortness of breath and weakness starting today. Covid + day 5. Endorses dehydration. Denies pain at this time.

## 2021-02-13 ENCOUNTER — Ambulatory Visit
Admission: RE | Admit: 2021-02-13 | Discharge: 2021-02-13 | Disposition: A | Discharge status: Home / Self Care | Payer: BC Managed Care – PPO | Source: Ambulatory Visit | Attending: Emergency Medicine | Admitting: Emergency Medicine

## 2021-02-13 VITALS — BP 134/81 | HR 95 | Temp 97.9°F | Resp 16

## 2021-02-13 DIAGNOSIS — N898 Other specified noninflammatory disorders of vagina: Secondary | ICD-10-CM | POA: Diagnosis not present

## 2021-02-13 DIAGNOSIS — N390 Urinary tract infection, site not specified: Secondary | ICD-10-CM

## 2021-02-13 LAB — POCT URINALYSIS DIP (MANUAL ENTRY)
Bilirubin, UA: NEGATIVE
Glucose, UA: 100 mg/dL — AB
Ketones, POC UA: NEGATIVE mg/dL
Nitrite, UA: POSITIVE — AB
Protein Ur, POC: 100 mg/dL — AB
Spec Grav, UA: 1.02 (ref 1.010–1.025)
Urobilinogen, UA: 1 E.U./dL
pH, UA: 6 (ref 5.0–8.0)

## 2021-02-13 LAB — POCT URINE PREGNANCY: Preg Test, Ur: NEGATIVE

## 2021-02-13 MED ORDER — FLUCONAZOLE 150 MG PO TABS: 150.0000 mg | ORAL_TABLET | Freq: Every day | ORAL | 0 refills | Status: DC

## 2021-02-13 MED ORDER — CEPHALEXIN 500 MG PO CAPS: 500.0000 mg | ORAL_CAPSULE | Freq: Two times a day (BID) | ORAL | 0 refills | Status: AC

## 2021-02-13 NOTE — Discharge Instructions (Addendum)
Take the antibiotic as directed.  The urine culture is pending.  We will call you if it shows the need to change or discontinue your antibiotic.    Your vaginal tests are pending.  Do not have sexual activity for at least 7 days.    Follow up with your primary care provider if your symptoms are not improving.

## 2021-02-13 NOTE — ED Triage Notes (Signed)
Pt here with white vaginal discharge, burning while urinating, pelvic discomfort, and urgency x 3 days.

## 2021-02-13 NOTE — ED Provider Notes (Signed)
Tracy Rice    CSN: [id number] Arrival date & time: 02/13/21  1713      History   Chief Complaint Chief Complaint  Patient presents with   Dysuria   Vaginal Discharge    HPI Tracy Rice is a 40 y.o. female.  Patient presents with 3-day history of dysuria and urinary frequency.  She also reports white vaginal discharge which she says is similar to previous yeast infections.  She denies fever, chills, abdominal pain, back pain, pelvic pain, or other symptoms.  Treatment at home with Azo.  The history is provided by the patient.   History reviewed. No pertinent past medical history.  There are no problems to display for this patient.   Past Surgical History:  Procedure Laterality Date   CESAREAN SECTION     TUBAL LIGATION      OB History   No obstetric history on file.      Home Medications    Prior to Admission medications   Medication Sig Start Date End Date Taking? Authorizing Provider  cephALEXin (KEFLEX) 500 MG capsule Take 1 capsule (500 mg total) by mouth 2 (two) times daily for 5 days. 02/13/21 02/18/21 Yes Mickie Bail, NP  fluconazole (DIFLUCAN) 150 MG tablet Take 1 tablet (150 mg total) by mouth daily. Take one tablet today.  May repeat in 3 days. 02/13/21  Yes Mickie Bail, NP  diphenhydrAMINE (BENADRYL ALLERGY) 25 MG tablet Take 1 tablet (25 mg total) by mouth every 4 (four) hours as needed. 11/13/19   Orvil Feil, PA-C  meclizine (ANTIVERT) 50 MG tablet Take 0.5 tablets (25 mg total) by mouth in the morning, at noon, in the evening, and at bedtime. 11/13/19   Orvil Feil, PA-C  metroNIDAZOLE (FLAGYL) 500 MG tablet Take 1 tablet (500 mg total) by mouth 2 (two) times daily. 07/20/16   Hassan Rowan, MD  phenazopyridine (PYRIDIUM) 200 MG tablet Take 1 tablet (200 mg total) by mouth 3 (three) times daily as needed for pain. 07/20/16   Hassan Rowan, MD  traZODone (DESYREL) 100 MG tablet Take 100 mg by mouth at bedtime.    [provider]   venlafaxine (EFFEXOR) 37.5 MG tablet Take 37.5 mg by mouth daily.    [provider]  venlafaxine (EFFEXOR) 75 MG tablet Take 75 mg by mouth daily.    [provider]    Family History History reviewed. No pertinent family history.  Social History Social History   Tobacco Use   Smoking status: Never   Smokeless tobacco: Never  Substance Use Topics   Alcohol use: No     Allergies   Betadine [povidone iodine] and Sulfa antibiotics   Review of Systems Review of Systems  Constitutional:  Negative for chills and fever.  Respiratory:  Negative for cough and shortness of breath.   Cardiovascular:  Negative for chest pain and palpitations.  Gastrointestinal:  Negative for abdominal pain and vomiting.  Genitourinary:  Positive for dysuria, frequency and vaginal discharge. Negative for hematuria and pelvic pain.  Skin:  Negative for color change and rash.  All other systems reviewed and are negative.   Physical Exam Triage Vital Signs ED Triage Vitals  Enc Vitals Group     BP      Pulse      Resp      Temp      Temp src      SpO2      Weight  Height      Head Circumference      Peak Flow      Pain Score      Pain Loc      Pain Edu?      Excl. in [place]?    No data found.  Updated Vital Signs BP 134/81   Pulse 95   Temp 97.9 F (36.6 C)   Resp 16   SpO2 95%   Visual Acuity Right Eye Distance:   Left Eye Distance:   Bilateral Distance:    Right Eye Near:   Left Eye Near:    Bilateral Near:     Physical Exam Vitals and nursing note reviewed.  Constitutional:      General: She is not in acute distress.    Appearance: She is well-developed. She is not ill-appearing.  HENT:     Head: Normocephalic and atraumatic.     Mouth/Throat:     Mouth: Mucous membranes are moist.  Eyes:     Conjunctiva/sclera: Conjunctivae normal.  Cardiovascular:     Rate and Rhythm: Normal rate and regular rhythm.     Heart sounds: Normal heart sounds.   Pulmonary:     Effort: Pulmonary effort is normal. No respiratory distress.     Breath sounds: Normal breath sounds.  Abdominal:     Palpations: Abdomen is soft.     Tenderness: There is no abdominal tenderness. There is no right CVA tenderness, left CVA tenderness, guarding or rebound.  Musculoskeletal:     Cervical back: Neck supple.  Skin:    General: Skin is warm and dry.  Neurological:     General: No focal deficit present.     Mental Status: She is alert and oriented to person, place, and time.     Gait: Gait normal.  Psychiatric:        Mood and Affect: Mood normal.        Behavior: Behavior normal.     UC Treatments / Results  Labs (all labs ordered are listed, but only abnormal results are displayed) Labs Reviewed  POCT URINALYSIS DIP (MANUAL ENTRY) - Abnormal; Notable for the following components:      Result Value   Color, UA orange (*)    Glucose, UA =100 (*)    Blood, UA moderate (*)    Protein Ur, POC =100 (*)    Nitrite, UA Positive (*)    Leukocytes, UA Trace (*)    All other components within normal limits  URINE CULTURE  POCT URINE PREGNANCY  CERVICOVAGINAL ANCILLARY ONLY    EKG   Radiology No results found.  Procedures Procedures (including critical care time)  Medications Ordered in UC Medications - No data to display  Initial Impression / Assessment and Plan / UC Course  I have reviewed the triage vital signs and the nursing notes.  Pertinent labs & imaging results that were available during my care of the patient were reviewed by me and considered in my medical decision making (see chart for details).  Urinary tract infection, vaginal discharge.  Treating with Keflex. Urine culture pending. Discussed with patient that we will call her if the urine culture shows the need to change or discontinue the antibiotic. Patient obtained vaginal self swab for testing.  Treating with Diflucan.  Instructed her to abstain from sexual activity for at  least 7 days.  Instructed her to follow-up with her PCP or OB/GYN if her symptoms are not improving.  Patient agrees to plan of  care.      Final Clinical Impressions(s) / UC Diagnoses   Final diagnoses:  Urinary tract infection without hematuria, site unspecified  Vaginal discharge     Discharge Instructions      Take the antibiotic as directed.  The urine culture is pending.  We will call you if it shows the need to change or discontinue your antibiotic.    Your vaginal tests are pending.  Do not have sexual activity for at least 7 days.    Follow up with your primary care provider if your symptoms are not improving.          ED Prescriptions     Medication Sig Dispense Auth. Provider   fluconazole (DIFLUCAN) 150 MG tablet Take 1 tablet (150 mg total) by mouth daily. Take one tablet today.  May repeat in 3 days. 2 tablet Mickie Bail, NP   cephALEXin (KEFLEX) 500 MG capsule Take 1 capsule (500 mg total) by mouth 2 (two) times daily for 5 days. 10 capsule Mickie Bail, NP      PDMP not reviewed this encounter.   Mickie Bail, NP 02/13/21 [phone]

## 2021-02-16 LAB — URINE CULTURE: Culture: >=100000 — AB

## 2021-02-16 LAB — CERVICOVAGINAL ANCILLARY ONLY
Bacterial Vaginitis (gardnerella): NEGATIVE
Candida Glabrata: NEGATIVE
Candida Vaginitis: NEGATIVE
Chlamydia: NEGATIVE
Comment: NEGATIVE
Comment: NEGATIVE
Comment: NEGATIVE
Comment: NEGATIVE
Comment: NEGATIVE
Comment: NORMAL
Neisseria Gonorrhea: NEGATIVE
Trichomonas: NEGATIVE

## 2021-09-19 ENCOUNTER — Encounter: Payer: Self-pay | Admitting: Emergency Medicine

## 2021-09-19 ENCOUNTER — Ambulatory Visit
Admission: EM | Admit: 2021-09-19 | Discharge: 2021-09-19 | Disposition: A | Discharge status: Home / Self Care | Payer: BC Managed Care – PPO | Attending: Emergency Medicine | Admitting: Emergency Medicine

## 2021-09-19 ENCOUNTER — Other Ambulatory Visit: Payer: Self-pay

## 2021-09-19 DIAGNOSIS — R300 Dysuria: Secondary | ICD-10-CM | POA: Insufficient documentation

## 2021-09-19 LAB — POCT URINALYSIS DIP (MANUAL ENTRY)
Bilirubin, UA: NEGATIVE
Blood, UA: NEGATIVE
Glucose, UA: NEGATIVE mg/dL
Ketones, POC UA: NEGATIVE mg/dL
Leukocytes, UA: NEGATIVE
Nitrite, UA: POSITIVE — AB
Protein Ur, POC: 30 mg/dL — AB
Spec Grav, UA: 1.02 (ref 1.010–1.025)
Urobilinogen, UA: 1 E.U./dL
pH, UA: 5.5 (ref 5.0–8.0)

## 2021-09-19 LAB — POCT URINE PREGNANCY: Preg Test, Ur: NEGATIVE

## 2021-09-19 MED ORDER — CEPHALEXIN 500 MG PO CAPS: 500.0000 mg | ORAL_CAPSULE | Freq: Two times a day (BID) | ORAL | 0 refills | Status: AC

## 2021-09-19 NOTE — Discharge Instructions (Addendum)
Take the antibiotic as directed.  The urine culture is pending.  We will call you if it shows the need to change or discontinue your antibiotic.    Follow up with your primary care provider if your symptoms are not improving.    

## 2021-09-19 NOTE — ED Provider Notes (Signed)
?[facility] ? ? ? ?CSN: [id number] ?Arrival date & time: 09/19/21  1234 ? ? ?  ? ?History   ?Chief Complaint ?Chief Complaint  ?Patient presents with  ? Dysuria  ? Back Pain  ? ? ?HPI ?JAILYNN LAVALAIS is a 41 y.o. female.  Patient presents with 3-day history of dysuria, urinary frequency, lower abdominal cramping with urination, low back pain.  She states this is similar to previous UTIs.  She denies fever, chills, nausea, vomiting, diarrhea, constipation, hematuria, vaginal discharge, pelvic pain, or other symptoms.  Treatment with Azo.  Patient reports history of recurrent UTIs; last episode in August 2022. ? ?The history is provided by the patient and medical records.  ? ?History reviewed. No pertinent past medical history. ? ?There are no problems to display for this patient. ? ? ?Past Surgical History:  ?Procedure Laterality Date  ? CESAREAN SECTION    ? TUBAL LIGATION    ? ? ?OB History   ?No obstetric history on file. ?  ? ? ? ?Home Medications   ? ?Prior to Admission medications   ?Medication Sig Start Date End Date Taking? Authorizing Provider  ?cephALEXin (KEFLEX) 500 MG capsule Take 1 capsule (500 mg total) by mouth 2 (two) times daily for 5 days. 09/19/21 09/24/21 Yes Mickie Bail, NP  ?diphenhydrAMINE (BENADRYL ALLERGY) 25 MG tablet Take 1 tablet (25 mg total) by mouth every 4 (four) hours as needed. 11/13/19   Orvil Feil, PA-C  ?fluconazole (DIFLUCAN) 150 MG tablet Take 1 tablet (150 mg total) by mouth daily. Take one tablet today.  May repeat in 3 days. 02/13/21   Mickie Bail, NP  ?meclizine (ANTIVERT) 50 MG tablet Take 0.5 tablets (25 mg total) by mouth in the morning, at noon, in the evening, and at bedtime. 11/13/19   Orvil Feil, PA-C  ?metroNIDAZOLE (FLAGYL) 500 MG tablet Take 1 tablet (500 mg total) by mouth 2 (two) times daily. 07/20/16   Hassan Rowan, MD  ?phenazopyridine (PYRIDIUM) 200 MG tablet Take 1 tablet (200 mg total) by mouth 3 (three) times daily as needed for pain.  07/20/16   Hassan Rowan, MD  ?traZODone (DESYREL) 100 MG tablet Take 100 mg by mouth at bedtime.    [provider]  ?venlafaxine (EFFEXOR) 37.5 MG tablet Take 37.5 mg by mouth daily.    [provider]  ?venlafaxine (EFFEXOR) 75 MG tablet Take 75 mg by mouth daily.    [provider]  ? ? ?Family History ?History reviewed. No pertinent family history. ? ?Social History ?Social History  ? ?Tobacco Use  ? Smoking status: Never  ? Smokeless tobacco: Never  ?Substance Use Topics  ? Alcohol use: No  ? ? ? ?Allergies   ?Betadine [povidone iodine] and Sulfa antibiotics ? ? ?Review of Systems ?Review of Systems  ?Constitutional:  Negative for chills and fever.  ?Gastrointestinal:  Positive for abdominal pain. Negative for vomiting.  ?Genitourinary:  Positive for dysuria and frequency. Negative for flank pain, hematuria, pelvic pain and vaginal discharge.  ?Musculoskeletal:  Positive for back pain. Negative for gait problem.  ?Skin:  Negative for color change and rash.  ?All other systems reviewed and are negative. ? ? ?Physical Exam ?Triage Vital Signs ?ED Triage Vitals  ?Enc Vitals Group  ?   BP   ?   Pulse   ?   Resp   ?   Temp   ?   Temp src   ?   SpO2   ?  Weight   ?   Height   ?   Head Circumference   ?   Peak Flow   ?   Pain Score   ?   Pain Loc   ?   Pain Edu?   ?   Excl. in GC?   ? ?No data found. ? ?Updated Vital Signs ?BP 124/68   Pulse 76   Temp 98.1 ?F (36.7 ?C)   Resp 18   SpO2 96%  ? ?Visual Acuity ?Right Eye Distance:   ?Left Eye Distance:   ?Bilateral Distance:   ? ?Right Eye Near:   ?Left Eye Near:    ?Bilateral Near:    ? ?Physical Exam ?Vitals and nursing note reviewed.  ?Constitutional:   ?   General: She is not in acute distress. ?   Appearance: She is well-developed. She is not ill-appearing.  ?HENT:  ?   Mouth/Throat:  ?   Mouth: Mucous membranes are moist.  ?Cardiovascular:  ?   Rate and Rhythm: Normal rate and regular rhythm.  ?   Heart sounds: Normal heart sounds.   ?Pulmonary:  ?   Effort: Pulmonary effort is normal. No respiratory distress.  ?   Breath sounds: Normal breath sounds.  ?Abdominal:  ?   General: Bowel sounds are normal.  ?   Palpations: Abdomen is soft.  ?   Tenderness: There is no abdominal tenderness. There is no right CVA tenderness, left CVA tenderness, guarding or rebound.  ?Musculoskeletal:  ?   Cervical back: Neck supple.  ?Skin: ?   General: Skin is warm and dry.  ?Neurological:  ?   Mental Status: She is alert.  ?Psychiatric:     ?   Mood and Affect: Mood normal.     ?   Behavior: Behavior normal.  ? ? ? ?UC Treatments / Results  ?Labs ?(all labs ordered are listed, but only abnormal results are displayed) ?Labs Reviewed  ?POCT URINALYSIS DIP (MANUAL ENTRY) - Abnormal; Notable for the following components:  ?    Result Value  ? Color, UA orange (*)   ? Protein Ur, POC =30 (*)   ? Nitrite, UA Positive (*)   ? All other components within normal limits  ?POCT URINE PREGNANCY - Normal  ?URINE CULTURE  ? ? ?EKG ? ? ?Radiology ?No results found. ? ?Procedures ?Procedures (including critical care time) ? ?Medications Ordered in UC ?Medications - No data to display ? ?Initial Impression / Assessment and Plan / UC Course  ?I have reviewed the triage vital signs and the nursing notes. ? ?Pertinent labs & imaging results that were available during my care of the patient were reviewed by me and considered in my medical decision making (see chart for details). ? ?  ?Dysuria.  Treating with Keflex. Urine culture pending. Discussed with patient that we will call her if the urine culture shows the need to change or discontinue the antibiotic. Instructed her to follow-up with her PCP if her symptoms are not improving. Patient agrees to plan of care.    ? ?Final Clinical Impressions(s) / UC Diagnoses  ? ?Final diagnoses:  ?Dysuria  ? ? ? ?Discharge Instructions   ? ?  ?Take the antibiotic as directed.  The urine culture is pending.  We will call you if it shows the need  to change or discontinue your antibiotic.   ? ?Follow up with your primary care provider if your symptoms are not improving.   ? ? ? ? ? ?  ED Prescriptions   ? ? Medication Sig Dispense Auth. Provider  ? cephALEXin (KEFLEX) 500 MG capsule Take 1 capsule (500 mg total) by mouth 2 (two) times daily for 5 days. 10 capsule Mickie Bail, NP  ? ?  ? ?PDMP not reviewed this encounter. ?  ?Mickie Bail, NP ?09/19/21 1340 ? ?

## 2021-09-19 NOTE — ED Triage Notes (Signed)
Pt aching in back and having increased urgency and frequency x 3 days ?

## 2021-09-21 LAB — URINE CULTURE: Culture: >=100000 — AB

## 2022-01-01 IMAGING — CR DG CHEST 2V
1 series · 2 of 2 positions shown · non-contrast
Comparison: None.

CLINICAL DATA: Chest pain and shortness of breath. Weakness. COVID
positive 5 days ago.

EXAM:
CHEST - 2 VIEW

[Series 1: dg chest 2 view · 0.14mm/px · 2 of 2 slices shown]
[im 1/2]
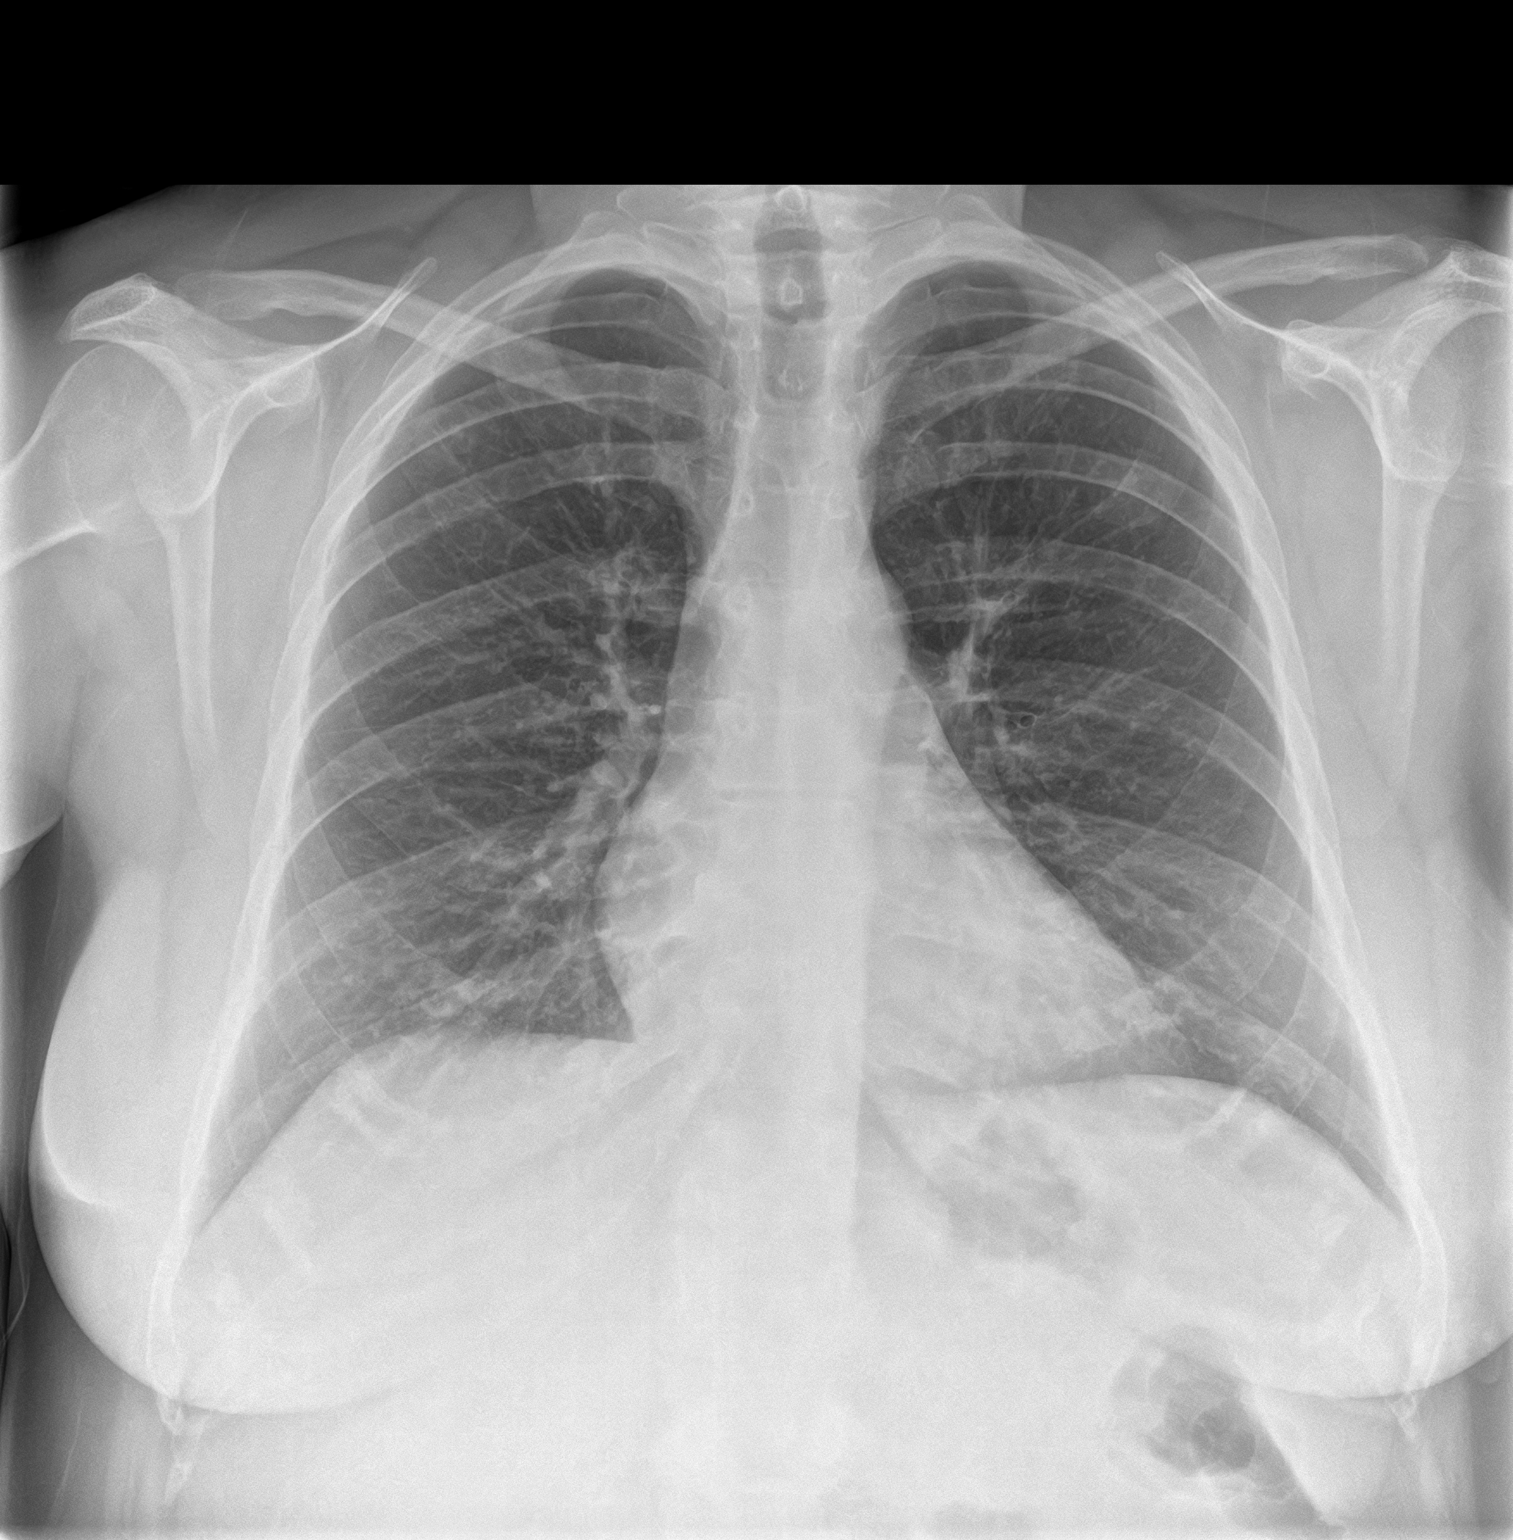
[im 2/2]
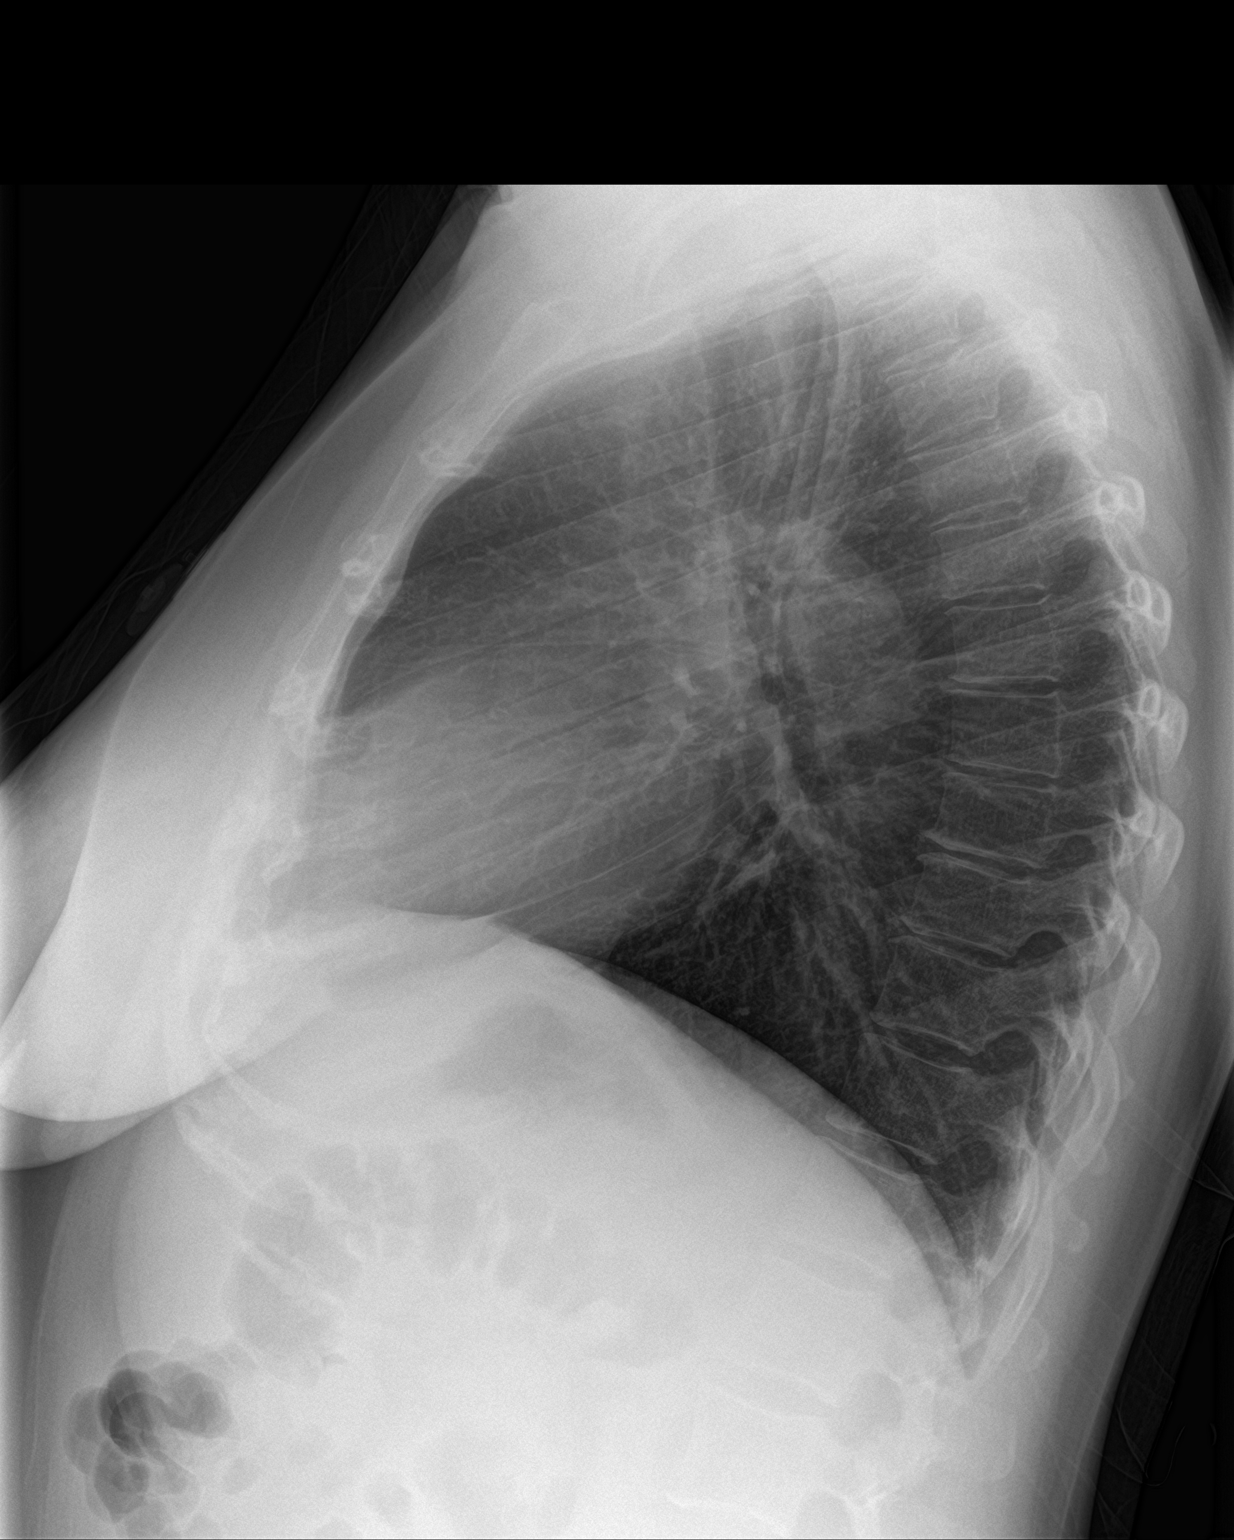

[2 of 2 positions shown; findings below may reference images not displayed]

FINDINGS: Vague opacities in both lung bases. The heart is normal in size with
normal mediastinal contours. No pulmonary edema, pleural effusion or
pneumothorax. No acute osseous abnormalities are seen.
IMPRESSION: Vague opacities in both lung bases, pattern typical of COVID
pneumonia.

## 2022-07-22 ENCOUNTER — Ambulatory Visit
Admission: RE | Admit: 2022-07-22 | Discharge: 2022-07-22 | Disposition: A | Discharge status: Home / Self Care | Payer: BC Managed Care – PPO | Source: Ambulatory Visit | Attending: Urgent Care | Admitting: Urgent Care

## 2022-07-22 VITALS — BP 121/81 | HR 74 | Temp 99.2°F | Resp 18

## 2022-07-22 DIAGNOSIS — J069 Acute upper respiratory infection, unspecified: Secondary | ICD-10-CM

## 2022-07-22 DIAGNOSIS — J209 Acute bronchitis, unspecified: Secondary | ICD-10-CM | POA: Diagnosis not present

## 2022-07-22 MED ORDER — BENZONATATE 100 MG PO CAPS: ORAL_CAPSULE | ORAL | 0 refills | Status: DC

## 2022-07-22 MED ORDER — AMOXICILLIN-POT CLAVULANATE 875-125 MG PO TABS: 1.0000 | ORAL_TABLET | Freq: Two times a day (BID) | ORAL | 0 refills | Status: DC

## 2022-07-22 MED ORDER — PREDNISONE 20 MG PO TABS: ORAL_TABLET | ORAL | 0 refills | Status: AC

## 2022-07-22 MED ORDER — PROMETHAZINE-DM 6.25-15 MG/5ML PO SYRP: 5.0000 mL | ORAL_SOLUTION | Freq: Four times a day (QID) | ORAL | 0 refills | Status: DC | PRN

## 2022-07-22 NOTE — Discharge Instructions (Addendum)
Follow up here or with your primary care provider if your symptoms are worsening or not improving with treatment.     

## 2022-07-22 NOTE — ED Triage Notes (Signed)
Pt. Presents to UC w/ a productive cough.Pt. states she has been sick for the past two weeks but the cough is getting worse. Pt. States she is producing brown and green mucus.

## 2022-07-22 NOTE — ED Provider Notes (Signed)
Tracy Rice    CSN: [id number] Arrival date & time: 07/22/22  1845      History   Chief Complaint Chief Complaint  Patient presents with   Cough    2 weeks  symptoms getting worse - Entered by patient    HPI Tracy Rice is a 42 y.o. female.    Cough   Patient presents with reported 2-week history of cough which she states is worsening. Cough is productive of thick brown and green "mucus". Denies fever.  No past medical history on file.  There are no problems to display for this patient.   Past Surgical History:  Procedure Laterality Date   CESAREAN SECTION     TUBAL LIGATION      OB History   No obstetric history on file.      Home Medications    Prior to Admission medications   Medication Sig Start Date End Date Taking? Authorizing Provider  diphenhydrAMINE (BENADRYL ALLERGY) 25 MG tablet Take 1 tablet (25 mg total) by mouth every 4 (four) hours as needed. 11/13/19   Lannie Fields, PA-C  fluconazole (DIFLUCAN) 150 MG tablet Take 1 tablet (150 mg total) by mouth daily. Take one tablet today.  May repeat in 3 days. 02/13/21   Sharion Balloon, NP  meclizine (ANTIVERT) 50 MG tablet Take 0.5 tablets (25 mg total) by mouth in the morning, at noon, in the evening, and at bedtime. 11/13/19   Lannie Fields, PA-C  metroNIDAZOLE (FLAGYL) 500 MG tablet Take 1 tablet (500 mg total) by mouth 2 (two) times daily. 07/20/16   Frederich Cha, MD  phenazopyridine (PYRIDIUM) 200 MG tablet Take 1 tablet (200 mg total) by mouth 3 (three) times daily as needed for pain. 07/20/16   Frederich Cha, MD  traZODone (DESYREL) 100 MG tablet Take 100 mg by mouth at bedtime.    [provider]  venlafaxine (EFFEXOR) 37.5 MG tablet Take 37.5 mg by mouth daily.    [provider]  venlafaxine (EFFEXOR) 75 MG tablet Take 75 mg by mouth daily.    [provider]    Family History No family history on file.  Social History Social History   Tobacco Use    Smoking status: Never   Smokeless tobacco: Never  Substance Use Topics   Alcohol use: No     Allergies   Betadine [povidone iodine] and Sulfa antibiotics   Review of Systems Review of Systems  Respiratory:  Positive for cough.      Physical Exam Triage Vital Signs ED Triage Vitals  Enc Vitals Group     BP      Pulse      Resp      Temp      Temp src      SpO2      Weight      Height      Head Circumference      Peak Flow      Pain Score      Pain Loc      Pain Edu?      Excl. in [place]?    No data found.  Updated Vital Signs There were no vitals taken for this visit.  Visual Acuity Right Eye Distance:   Left Eye Distance:   Bilateral Distance:    Right Eye Near:   Left Eye Near:    Bilateral Near:     Physical Exam Vitals reviewed.  Constitutional:  Appearance: Normal appearance.  Cardiovascular:     Rate and Rhythm: Normal rate and regular rhythm.     Pulses: Normal pulses.     Heart sounds: Normal heart sounds.  Pulmonary:     Effort: Pulmonary effort is normal.     Breath sounds: Normal breath sounds. No wheezing.  Skin:    General: Skin is warm and dry.  Neurological:     General: No focal deficit present.     Mental Status: She is alert and oriented to person, place, and time.  Psychiatric:        Mood and Affect: Mood normal.        Behavior: Behavior normal.      UC Treatments / Results  Labs (all labs ordered are listed, but only abnormal results are displayed) Labs Reviewed - No data to display  EKG   Radiology No results found.  Procedures Procedures (including critical care time)  Medications Ordered in UC Medications - No data to display  Initial Impression / Assessment and Plan / UC Course  I have reviewed the triage vital signs and the nursing notes.  Pertinent labs & imaging results that were available during my care of the patient were reviewed by me and considered in my medical decision making (see chart for  details).   Patient is afebrile here without recent antipyretics. Satting well on room air. Overall is ill appearing, well hydrated, without respiratory distress. Pulmonary exam is remarkable for frequent dry sounding cough.  Lungs CTAB without wheezing, rhonchi, rales.  Suspect acute bronchitis though after 2 weeks cannot rule out secondary bacterial infection, possibly upper respiratory.  Will prescribe Augmentin for antibiotic therapy as well as an anti-inflammatory corticosteroid to treat the bronchitis. Providing benzonatate and Promethazine DM for cough symptoms.  Final Clinical Impressions(s) / UC Diagnoses   Final diagnoses:  None   Discharge Instructions   None    ED Prescriptions   None    PDMP not reviewed this encounter.   Rose Phi, [place] 07/22/22 1920

## 2022-07-27 ENCOUNTER — Telehealth: Payer: Self-pay | Admitting: Nurse Practitioner

## 2022-07-27 DIAGNOSIS — B379 Candidiasis, unspecified: Secondary | ICD-10-CM

## 2022-07-27 MED ORDER — FLUCONAZOLE 150 MG PO TABS: 150.0000 mg | ORAL_TABLET | Freq: Every day | ORAL | 0 refills | Status: DC

## 2022-07-27 NOTE — Telephone Encounter (Signed)
Contacted clinic and DOB was verified.  Patient was seen yesterday in urgent care and treated with Augmentin for bronchitis. Calling today to request Diflucan for antibiotic use yeast infection. Chart allergies reviewed. Rx Diflucan sent to pharmacy on file

## 2023-05-10 ENCOUNTER — Encounter: Payer: Self-pay | Admitting: Oncology

## 2023-05-10 ENCOUNTER — Encounter: Payer: Self-pay | Admitting: *Deleted

## 2023-05-10 ENCOUNTER — Telehealth: Payer: Self-pay | Admitting: Oncology

## 2023-05-10 ENCOUNTER — Inpatient Hospital Stay: Payer: BC Managed Care – PPO | Attending: Oncology | Admitting: Oncology

## 2023-05-10 ENCOUNTER — Inpatient Hospital Stay: Payer: BC Managed Care – PPO

## 2023-05-10 ENCOUNTER — Other Ambulatory Visit: Payer: Self-pay | Admitting: Oncology

## 2023-05-10 VITALS — BP 131/78 | HR 74 | Temp 98.4°F | Resp 16 | Ht 64.0 in | Wt 188.0 lb

## 2023-05-10 DIAGNOSIS — D509 Iron deficiency anemia, unspecified: Secondary | ICD-10-CM | POA: Insufficient documentation

## 2023-05-10 DIAGNOSIS — E611 Iron deficiency: Secondary | ICD-10-CM | POA: Insufficient documentation

## 2023-05-10 DIAGNOSIS — R519 Headache, unspecified: Secondary | ICD-10-CM | POA: Insufficient documentation

## 2023-05-10 DIAGNOSIS — E538 Deficiency of other specified B group vitamins: Secondary | ICD-10-CM | POA: Diagnosis not present

## 2023-05-10 DIAGNOSIS — M898X9 Other specified disorders of bone, unspecified site: Secondary | ICD-10-CM | POA: Insufficient documentation

## 2023-05-10 LAB — CBC (CANCER CENTER ONLY)
HCT: 40.7 % (ref 36.0–46.0)
Hemoglobin: 13.3 g/dL (ref 12.0–15.0)
MCH: 30.9 pg (ref 26.0–34.0)
MCHC: 32.7 g/dL (ref 30.0–36.0)
MCV: 94.7 fL (ref 80.0–100.0)
Platelet Count: 285 10*3/uL (ref 150–400)
RBC: 4.3 MIL/uL (ref 3.87–5.11)
RDW: 13.3 % (ref 11.5–15.5)
WBC Count: 5.9 10*3/uL (ref 4.0–10.5)
nRBC: 0 % (ref 0.0–0.2)

## 2023-05-10 LAB — IRON AND TIBC
Iron: 69 ug/dL (ref 28–170)
Saturation Ratios: 17 % (ref 10.4–31.8)
TIBC: 403 ug/dL (ref 250–450)
UIBC: 334 ug/dL

## 2023-05-10 LAB — FOLATE: Folate: 14.3 ng/mL (ref >5.9)

## 2023-05-10 LAB — FERRITIN: Ferritin: 10 ng/mL — ABNORMAL LOW (ref 11–307)

## 2023-05-10 LAB — VITAMIN B12: Vitamin B-12: 105 pg/mL — ABNORMAL LOW (ref 180–914)

## 2023-05-10 NOTE — Progress Notes (Signed)
[facility]  [phone]) [phone] [phone]  ID: Tamela Oddi OB: 05-12-1981  MR#: [id number]  LOV#:[id number]  Patient Care Team: [facility] as PCP - General  CHIEF COMPLAINT: History of iron deficiency anemia.  INTERVAL HISTORY: Patient is a 42 year old female with a reported history of iron deficiency anemia who is referred to clinic for further evaluation.  She complains of chronic fatigue, headache, and bone pain.  She also has several other chronic complaints.  She has no other neurologic complaints.  She denies any recent fevers or illnesses.  She has a good appetite and denies weight loss.  She has no chest pain, shortness of breath, cough, or hemoptysis.  She denies any nausea, vomiting, constipation, or diarrhea.  She has no melena or hematochezia.  She has no urinary complaints.  Patient offers no further specific complaints today.  REVIEW OF SYSTEMS:   Review of Systems  Constitutional:  Positive for malaise/fatigue. Negative for fever and weight loss.  Respiratory: Negative.  Negative for cough, hemoptysis and shortness of breath.   Cardiovascular: Negative.  Negative for chest pain and leg swelling.  Gastrointestinal: Negative.  Negative for abdominal pain, blood in stool and melena.  Genitourinary: Negative.  Negative for hematuria.  Musculoskeletal:  Negative for back pain.  Skin: Negative.  Negative for rash.  Neurological:  Positive for weakness and headaches. Negative for dizziness and focal weakness.  Psychiatric/Behavioral: Negative.  The patient is not nervous/anxious.     As per HPI. Otherwise, a complete review of systems is negative.  PAST MEDICAL HISTORY: History reviewed. No pertinent past medical history.  PAST SURGICAL HISTORY: Past Surgical History:  Procedure Laterality Date   CESAREAN SECTION     TUBAL LIGATION      FAMILY HISTORY: Family History  Problem Relation Age of Onset   Cancer Mother         Breast cancer    ADVANCED DIRECTIVES (Y/N):  N  HEALTH MAINTENANCE: Social History   Tobacco Use   Smoking status: Never   Smokeless tobacco: Never  Substance Use Topics   Alcohol use: No   Drug use: Never     Colonoscopy:  PAP:  Bone density:  Lipid panel:  Allergies  Allergen Reactions   Povidone-Iodine Hives   Chlorhexidine Gluconate Dermatitis    Significant rash and puritis   Betadine [Povidone Iodine] Hives   Sulfa Antibiotics Other (See Comments)    Thrush    Current Outpatient Medications  Medication Sig Dispense Refill   traZODone (DESYREL) 100 MG tablet Take 100 mg by mouth at bedtime.     venlafaxine (EFFEXOR) 37.5 MG tablet Take 37.5 mg by mouth daily.     venlafaxine (EFFEXOR) 75 MG tablet Take 75 mg by mouth daily.     No current facility-administered medications for this visit.    OBJECTIVE: Vitals:   05/10/23 1340  BP: 131/78  Pulse: 74  Resp: 16  Temp: 98.4 F (36.9 C)  SpO2: 100%     Body mass index is 32.27 kg/m.    ECOG FS:1 - Symptomatic but completely ambulatory  General: Well-developed, well-nourished, no acute distress. Eyes: Pink conjunctiva, anicteric sclera. HEENT: Normocephalic, moist mucous membranes. Lungs: No audible wheezing or coughing. Heart: Regular rate and rhythm. Abdomen: Soft, nontender, no obvious distention. Musculoskeletal: No edema, cyanosis, or clubbing. Neuro: Alert, answering all questions appropriately. Cranial nerves grossly intact. Skin: No rashes or petechiae noted. Psych: Normal affect. Lymphatics: No cervical, calvicular, axillary or inguinal LAD.  LAB RESULTS:  Lab Results  Component Value Date   NA 138 07/06/2020   K 4.2 07/06/2020   CL 102 07/06/2020   CO2 24 07/06/2020   GLUCOSE 90 07/06/2020   BUN 9 07/06/2020   CREATININE 0.69 07/06/2020   CALCIUM 9.4 07/06/2020   PROT 7.4 04/01/2014   ALBUMIN 4.0 04/01/2014   AST 22 04/01/2014   ALT 35 04/01/2014   ALKPHOS 119 (H) 04/01/2014    BILITOT 0.8 04/01/2014   GFRNONAA >60 07/06/2020   GFRAA >60 11/13/2019    Lab Results  Component Value Date   WBC 5.9 05/10/2023   HGB 13.3 05/10/2023   HCT 40.7 05/10/2023   MCV 94.7 05/10/2023   PLT 285 05/10/2023   No results found for: "IRON", "TIBC", "IRONPCTSAT"  No results found for: "FERRITIN"   STUDIES: No results found.  ASSESSMENT: History of iron deficiency anemia.  PLAN:    History of iron deficiency anemia: By report patient has heavy menses and has had anemia in the past.  Her hemoglobin currently is within normal limits.  Recent iron stores were also within normal limits, but repeat laboratory work from today is pending at time of dictation.  Have also ordered B12 and folate levels for completeness.  No intervention is needed at this time.  Patient does not require IV iron.  Follow-up will be based on laboratory results. Fatigue: Unlikely related to blood work.  Likely multifactorial. Bone pain: Unclear etiology.  Unrelated to iron deficiency.  I spent a total of 45 minutes reviewing chart data, face-to-face evaluation with the patient, counseling and coordination of care as detailed above.   Patient expressed understanding and was in agreement with this plan. She also understands that She can call clinic at any time with any questions, concerns, or complaints.    Jeralyn Ruths, MD   05/10/2023 3:51 PM

## 2023-05-10 NOTE — Telephone Encounter (Signed)
Pt left a vm and stated she was returning a phone call

## 2023-05-10 NOTE — Progress Notes (Signed)
Patient complains of heavy menstrual cycles, dizziness, headache, hair loss, teeth discoloration, bone aches, and numbness in hands. Expresses concerns about having iron infusions.

## 2023-05-11 ENCOUNTER — Other Ambulatory Visit: Payer: Self-pay | Admitting: Oncology

## 2023-05-12 ENCOUNTER — Other Ambulatory Visit: Payer: Self-pay | Admitting: *Deleted

## 2023-05-12 DIAGNOSIS — D509 Iron deficiency anemia, unspecified: Secondary | ICD-10-CM

## 2023-05-18 ENCOUNTER — Inpatient Hospital Stay: Payer: BC Managed Care – PPO

## 2023-05-18 VITALS — BP 115/72 | HR 72 | Temp 97.8°F | Resp 17

## 2023-05-18 DIAGNOSIS — E611 Iron deficiency: Secondary | ICD-10-CM

## 2023-05-18 DIAGNOSIS — D509 Iron deficiency anemia, unspecified: Secondary | ICD-10-CM | POA: Diagnosis not present

## 2023-05-18 MED ORDER — IRON SUCROSE 20 MG/ML IV SOLN
200.0000 mg | Freq: Once | INTRAVENOUS | Status: AC
  Administered 2023-05-18: 200 mg via INTRAVENOUS
  Filled 2023-05-18: qty 10

## 2023-05-18 MED ORDER — CYANOCOBALAMIN 1000 MCG/ML IJ SOLN
1000.0000 ug | Freq: Once | INTRAMUSCULAR | Status: AC
  Administered 2023-05-18: 1000 ug via INTRAMUSCULAR
  Filled 2023-05-18: qty 1

## 2023-05-18 NOTE — Patient Instructions (Signed)
[facility] - A DEPT OF [facility]  Discharge Instructions: Thank you for choosing [facility] to provide your oncology and hematology care.  If you have a lab appointment with the Cancer Center, please go directly to the Cancer Center and check in at the registration area.  Wear comfortable clothing and clothing appropriate for easy access to any Portacath or PICC line.   We strive to give you quality time with your provider. You may need to reschedule your appointment if you arrive late (15 or more minutes).  Arriving late affects you and other patients whose appointments are after yours.  Also, if you miss three or more appointments without notifying the office, you may be dismissed from the clinic at the provider's discretion.      For prescription refill requests, have your pharmacy contact our office and allow 72 hours for refills to be completed.    Today you received the following chemotherapy and/or immunotherapy agents B 12 and Venofer      To help prevent nausea and vomiting after your treatment, we encourage you to take your nausea medication as directed.  BELOW ARE SYMPTOMS THAT SHOULD BE REPORTED IMMEDIATELY: *FEVER GREATER THAN 100.4 F (38 C) OR HIGHER *CHILLS OR SWEATING *NAUSEA AND VOMITING THAT IS NOT CONTROLLED WITH YOUR NAUSEA MEDICATION *UNUSUAL SHORTNESS OF BREATH *UNUSUAL BRUISING OR BLEEDING *URINARY PROBLEMS (pain or burning when urinating, or frequent urination) *BOWEL PROBLEMS (unusual diarrhea, constipation, pain near the anus) TENDERNESS IN MOUTH AND THROAT WITH OR WITHOUT PRESENCE OF ULCERS (sore throat, sores in mouth, or a toothache) UNUSUAL RASH, SWELLING OR PAIN  UNUSUAL VAGINAL DISCHARGE OR ITCHING   Items with * indicate a potential emergency and should be followed up as soon as possible or go to the Emergency Department if any problems should occur.  Please show the CHEMOTHERAPY ALERT CARD or  IMMUNOTHERAPY ALERT CARD at check-in to the Emergency Department and triage nurse.  Should you have questions after your visit or need to cancel or reschedule your appointment, please contact [facility] - A DEPT OF [facility]  [phone] and follow the prompts.  Office hours are 8:00 a.m. to 4:30 p.m. Monday - Friday. Please note that voicemails left after 4:00 p.m. may not be returned until the following business day.  We are closed weekends and major holidays. You have access to a nurse at all times for urgent questions. Please call the main number to the clinic [phone] and follow the prompts.  For any non-urgent questions, you may also contact your provider using MyChart. We now offer e-Visits for anyone 42 and older to request care online for non-urgent symptoms. For details visit [email or web address].   Also download the MyChart app! Go to the app store, search "MyChart", open the app, select [facility], and log in with your MyChart username and password.

## 2023-05-23 ENCOUNTER — Inpatient Hospital Stay: Payer: BC Managed Care – PPO

## 2023-05-23 VITALS — BP 127/73 | HR 81 | Temp 97.8°F | Resp 17

## 2023-05-23 DIAGNOSIS — D509 Iron deficiency anemia, unspecified: Secondary | ICD-10-CM | POA: Diagnosis not present

## 2023-05-23 DIAGNOSIS — E611 Iron deficiency: Secondary | ICD-10-CM

## 2023-05-23 MED ORDER — SODIUM CHLORIDE 0.9% FLUSH
10.0000 mL | Freq: Once | INTRAVENOUS | Status: AC | PRN
  Administered 2023-05-23: 10 mL
  Filled 2023-05-23: qty 10

## 2023-05-23 MED ORDER — IRON SUCROSE 20 MG/ML IV SOLN
200.0000 mg | Freq: Once | INTRAVENOUS | Status: AC
  Administered 2023-05-23: 200 mg via INTRAVENOUS
  Filled 2023-05-23: qty 10

## 2023-05-25 ENCOUNTER — Ambulatory Visit: Payer: BC Managed Care – PPO

## 2023-06-17 ENCOUNTER — Inpatient Hospital Stay: Payer: BC Managed Care – PPO | Attending: Oncology

## 2023-06-17 ENCOUNTER — Inpatient Hospital Stay: Payer: BC Managed Care – PPO

## 2023-06-17 DIAGNOSIS — D509 Iron deficiency anemia, unspecified: Secondary | ICD-10-CM | POA: Insufficient documentation

## 2023-06-17 DIAGNOSIS — E611 Iron deficiency: Secondary | ICD-10-CM

## 2023-06-17 MED ORDER — CYANOCOBALAMIN 1000 MCG/ML IJ SOLN
1000.0000 ug | Freq: Once | INTRAMUSCULAR | Status: AC
  Administered 2023-06-17: 1000 ug via INTRAMUSCULAR
  Filled 2023-06-17: qty 1

## 2023-06-27 ENCOUNTER — Encounter: Payer: Self-pay | Admitting: Oncology

## 2023-07-18 ENCOUNTER — Inpatient Hospital Stay: Payer: 59

## 2023-07-18 ENCOUNTER — Inpatient Hospital Stay: Payer: 59 | Attending: Oncology

## 2023-07-18 ENCOUNTER — Encounter: Payer: Self-pay | Admitting: Oncology

## 2023-07-18 DIAGNOSIS — E611 Iron deficiency: Secondary | ICD-10-CM

## 2023-07-18 DIAGNOSIS — E538 Deficiency of other specified B group vitamins: Secondary | ICD-10-CM | POA: Diagnosis present

## 2023-07-18 MED ORDER — CYANOCOBALAMIN 1000 MCG/ML IJ SOLN
1000.0000 ug | Freq: Once | INTRAMUSCULAR | Status: AC
  Administered 2023-07-18: 1000 ug via INTRAMUSCULAR
  Filled 2023-07-18: qty 1

## 2023-07-19 ENCOUNTER — Telehealth: Payer: Self-pay | Admitting: *Deleted

## 2023-07-19 NOTE — Telephone Encounter (Signed)
Patient requested to transfer her care to Dr. Mervyn Skeeters from Dr. Orlie Dakin. Dr. Mervyn Skeeters has spoken to Dr. Orlie Dakin.  I have spoken to the patient. She was very pleasant and grateful that Dr. Mervyn Skeeters would see her. I have explained to her the policy that there is a one time change of providers. She stated that she just wanted another doc that would be able to explain to her the cause of the anemia and have time to answer the questions. She stated that she has had a lot of joint pain and her pcp told her the joint pain was r/t to the low iron. Pcp has r/o vit d deficiency. She wanted clarify this with Dr. Mervyn Skeeters as Dr .Orlie Dakin suggested pt to contact pcp to discuss joint pain.  I told her that Dr. Mervyn Skeeters was recommending that she return in 4 weeks with labs and see her. Pt agreed with this plan.  Msg sent to Scheduling to arrange apts in 4 weeks for lab - day-1 and lab/ possible iv venofer on day2. Pt aware that she may or may not need the iron based off labs. Pt thanked me for calling her.

## 2023-07-21 ENCOUNTER — Telehealth: Payer: Self-pay | Admitting: Internal Medicine

## 2023-07-21 NOTE — Telephone Encounter (Signed)
Patient called to reschedule appointment for lab to early morning and  MD/infusion to later in the afternoon. Appointments rescheduled as requested

## 2023-08-16 ENCOUNTER — Inpatient Hospital Stay: Payer: 59 | Attending: Oncology

## 2023-08-16 DIAGNOSIS — D509 Iron deficiency anemia, unspecified: Secondary | ICD-10-CM | POA: Diagnosis present

## 2023-08-16 DIAGNOSIS — E538 Deficiency of other specified B group vitamins: Secondary | ICD-10-CM | POA: Insufficient documentation

## 2023-08-16 LAB — CBC WITH DIFFERENTIAL/PLATELET
Abs Immature Granulocytes: 0.02 10*3/uL (ref 0.00–0.07)
Basophils Absolute: 0 10*3/uL (ref 0.0–0.1)
Basophils Relative: 1 %
Eosinophils Absolute: 0.1 10*3/uL (ref 0.0–0.5)
Eosinophils Relative: 1 %
HCT: 41.4 % (ref 36.0–46.0)
Hemoglobin: 14.2 g/dL (ref 12.0–15.0)
Immature Granulocytes: 0 %
Lymphocytes Relative: 32 %
Lymphs Abs: 1.8 10*3/uL (ref 0.7–4.0)
MCH: 32.2 pg (ref 26.0–34.0)
MCHC: 34.3 g/dL (ref 30.0–36.0)
MCV: 93.9 fL (ref 80.0–100.0)
Monocytes Absolute: 0.3 10*3/uL (ref 0.1–1.0)
Monocytes Relative: 6 %
Neutro Abs: 3.5 10*3/uL (ref 1.7–7.7)
Neutrophils Relative %: 60 %
Platelets: 289 10*3/uL (ref 150–400)
RBC: 4.41 MIL/uL (ref 3.87–5.11)
RDW: 12.5 % (ref 11.5–15.5)
WBC: 5.7 10*3/uL (ref 4.0–10.5)
nRBC: 0 % (ref 0.0–0.2)

## 2023-08-16 LAB — IRON AND TIBC
Iron: 180 ug/dL — ABNORMAL HIGH (ref 28–170)
Saturation Ratios: 53 % — ABNORMAL HIGH (ref 10.4–31.8)
TIBC: 337 ug/dL (ref 250–450)
UIBC: 157 ug/dL

## 2023-08-16 LAB — FERRITIN: Ferritin: 28 ng/mL (ref 11–307)

## 2023-08-16 LAB — VITAMIN B12: Vitamin B-12: 131 pg/mL — ABNORMAL LOW (ref 180–914)

## 2023-08-17 ENCOUNTER — Other Ambulatory Visit: Payer: Self-pay

## 2023-08-17 ENCOUNTER — Other Ambulatory Visit: Payer: 59

## 2023-08-18 ENCOUNTER — Inpatient Hospital Stay: Payer: 59 | Admitting: Internal Medicine

## 2023-08-18 ENCOUNTER — Encounter: Payer: Self-pay | Admitting: Internal Medicine

## 2023-08-18 ENCOUNTER — Ambulatory Visit: Payer: Self-pay | Admitting: Internal Medicine

## 2023-08-18 ENCOUNTER — Inpatient Hospital Stay: Payer: 59

## 2023-08-18 ENCOUNTER — Ambulatory Visit: Payer: Self-pay

## 2023-08-18 VITALS — BP 144/88 | HR 86 | Temp 98.7°F | Resp 16 | Wt 193.0 lb

## 2023-08-18 DIAGNOSIS — D509 Iron deficiency anemia, unspecified: Secondary | ICD-10-CM | POA: Diagnosis not present

## 2023-08-18 DIAGNOSIS — K529 Noninfective gastroenteritis and colitis, unspecified: Secondary | ICD-10-CM | POA: Insufficient documentation

## 2023-08-18 DIAGNOSIS — R5383 Other fatigue: Secondary | ICD-10-CM | POA: Insufficient documentation

## 2023-08-18 DIAGNOSIS — E538 Deficiency of other specified B group vitamins: Secondary | ICD-10-CM | POA: Diagnosis not present

## 2023-08-18 DIAGNOSIS — E611 Iron deficiency: Secondary | ICD-10-CM

## 2023-08-18 DIAGNOSIS — M898X9 Other specified disorders of bone, unspecified site: Secondary | ICD-10-CM | POA: Diagnosis not present

## 2023-08-18 MED ORDER — CYANOCOBALAMIN 1000 MCG/ML IJ SOLN
1000.0000 ug | Freq: Once | INTRAMUSCULAR | Status: AC
  Administered 2023-08-18: 1000 ug via INTRAMUSCULAR
  Filled 2023-08-18: qty 1

## 2023-08-18 NOTE — Progress Notes (Signed)
 Patient is still having the diarrhea, she is still having some fatigue. She is wanting to take the next step when it comes to GI issues.

## 2023-08-18 NOTE — Progress Notes (Signed)
 [facility]  Telephone:([phone]) [phone] Fax:(336) [phone]  ID: Tamela Oddi OB: Feb 13, 1981  MR#: [id number]  YQM#:[id number]  Patient Care Team: [facility] as PCP - General Michaelyn Barter, MD as Consulting Physician (Oncology)  Reason for visit - IDA, B12 deficiency  HPI: Tracy Rice is a 43 y.o. female with no significant past medical history follows with hematology for iron deficiency anemia and B12 deficiency.  She has history of iron deficiency for several years.  Has history of C-section. Reports fatigue which in turn causes bone pain for the past 2 years. Chronic diarrhea for several years.  Colonoscopy from 04/2021 showed 5 sessile 2 to 5 mm polyp in the transverse ascending and cecum.  Pathology showed tubular adenoma.  Follows with Dr. Lolita Lenz, GI with [facility] health. Has history of heavy menstrual cycles.  Of chart, received IV Venofer 200 mg x 2 doses in November 2024. On B12 injections monthly.  Interval history Patient was seen today as follow-up accompanied with her husband. She continues to have fatigue and bone pain.  Concerned it might be coming from the iron and B12 deficiency as informed by her PCP.  Vitamin D was checked and was normal.  She is scheduled to see specialist for menpause.  Dose for estradiol was currently increased.  REVIEW OF SYSTEMS:   ROS  As per HPI. Otherwise, a complete review of systems is negative.  PAST MEDICAL HISTORY: History reviewed. No pertinent past medical history.  PAST SURGICAL HISTORY: Past Surgical History:  Procedure Laterality Date   CESAREAN SECTION     TUBAL LIGATION      FAMILY HISTORY: Family History  Problem Relation Age of Onset   Cancer Mother        Breast cancer    HEALTH MAINTENANCE: Social History   Tobacco Use   Smoking status: Never   Smokeless tobacco: Never  Substance Use Topics   Alcohol use: No   Drug use: Never     Allergies  Allergen Reactions    Povidone-Iodine Hives   Chlorhexidine Gluconate Dermatitis    Significant rash and puritis   Betadine [Povidone Iodine] Hives   Sulfa Antibiotics Other (See Comments)    Thrush    Current Outpatient Medications  Medication Sig Dispense Refill   traZODone (DESYREL) 100 MG tablet Take 100 mg by mouth at bedtime.     venlafaxine XR (EFFEXOR-XR) 150 MG 24 hr capsule Take 150 mg by mouth daily.     venlafaxine (EFFEXOR) 37.5 MG tablet Take 37.5 mg by mouth daily. (Patient not taking: Reported on 08/18/2023)     No current facility-administered medications for this visit.    OBJECTIVE: Vitals:   08/18/23 1427  BP: (!) 144/88  Pulse: 86  Resp: 16  Temp: 98.7 F (37.1 C)  SpO2: 100%     Body mass index is 33.13 kg/m.      General: Well-developed, well-nourished, no acute distress. Eyes: Pink conjunctiva, anicteric sclera. HEENT: Normocephalic, moist mucous membranes, clear oropharnyx. Lungs: Clear to auscultation bilaterally. Heart: Regular rate and rhythm. No rubs, murmurs, or gallops. Abdomen: Soft, nontender, nondistended. No organomegaly noted, normoactive bowel sounds. Musculoskeletal: No edema, cyanosis, or clubbing. Neuro: Alert, answering all questions appropriately. Cranial nerves grossly intact. Skin: No rashes or petechiae noted. Psych: Normal affect. Lymphatics: No cervical, calvicular, axillary or inguinal LAD.   LAB RESULTS:  Lab Results  Component Value Date   NA 138 07/06/2020   K 4.2 07/06/2020   CL 102 07/06/2020  CO2 24 07/06/2020   GLUCOSE 90 07/06/2020   BUN 9 07/06/2020   CREATININE 0.69 07/06/2020   CALCIUM 9.4 07/06/2020   PROT 7.4 04/01/2014   ALBUMIN 4.0 04/01/2014   AST 22 04/01/2014   ALT 35 04/01/2014   ALKPHOS 119 (H) 04/01/2014   BILITOT 0.8 04/01/2014   GFRNONAA >60 07/06/2020   GFRAA >60 11/13/2019    Lab Results  Component Value Date   WBC 5.7 08/16/2023   NEUTROABS 3.5 08/16/2023   HGB 14.2 08/16/2023   HCT 41.4  08/16/2023   MCV 93.9 08/16/2023   PLT 289 08/16/2023    Lab Results  Component Value Date   TIBC 337 08/16/2023   TIBC 403 05/10/2023   FERRITIN 28 08/16/2023   FERRITIN 10 (L) 05/10/2023   IRONPCTSAT 53 (H) 08/16/2023   IRONPCTSAT 17 05/10/2023     STUDIES: No results found.  ASSESSMENT AND PLAN:   Tracy Rice is a 43 y.o. female with pmh of no significant past medical history follows with hematology for iron deficiency anemia and B12 deficiency.  # Iron deficiency anemia -Could be related to heavy menstrual cycle.  Last colonoscopy was from November 2022 through by Dr. Vaughan Basta at [facility] which showed polyps.  Path tubular adenoma as above.  Planning for EGD if iron deficiency persist. -Received IV Venofer 200 mg x 2 doses in November 2024. -Hemoglobin today is normal at 14.2.  Ferritin has improved from 10-28.  Saturation is 53%.  Will hold IV Venofer today.  # Vitamin B12 deficiency - ?  Malabsorption with history of chronic diarrhea -Celiac panel previously checked was negative.  Will check pernicious anemia panel next time. -B12 has slightly improved from 105-131.  Continue with monthly vitamin B12 injections.  I have also advised to add oral B12 supplements 1000 mcg once daily.  Recheck B12 in 3 months.  # Chronic diarrhea - Chronic diarrhea for several years. Colonoscopy from 04/2021 showed 5 sessile 2 to 5 mm polyp in the transverse ascending and cecum.  Pathology showed tubular adenoma.  Follows with Dr. Lolita Lenz, GI with [facility]. -Was advised to use Imodium or Pepto-Bismol as needed.  There was also concern for postcholecystectomy diarrhea.  Patient plans to schedule follow-up again with GI.  Advised to discuss if there is role for bile acid salts.  # Fatigue # Bone pain -Less likely to be related to iron/B12 deficiency. -She does has history of snoring at night.  Offered referral to sleep study to rule out OSA which can cause fatigue.  She will wait to correct B12  levels and see if her symptoms improve. -Referral to rheumatology with concerns for bone pain. -Vitamin D level was normal. -She is also scheduled with menopause specialist if there are any hormonal changes contributing to her symptoms.  Orders Placed This Encounter  Procedures   CBC with Differential/Platelet   Iron and TIBC   Ferritin   Vitamin B12   Intrinsic Factor Antibodies   Anti-parietal antibody   Ambulatory referral to Rheumatology   RTC in 3 months for MD visit, labs, possible Venofer, vitamin B12 injection  Patient expressed understanding and was in agreement with this plan. She also understands that She can call clinic at any time with any questions, concerns, or complaints.   I spent a total of 45 minutes reviewing chart data, face-to-face evaluation with the patient, counseling and coordination of care as detailed above.  Michaelyn Barter, MD   08/18/2023 2:36 PM

## 2023-08-18 NOTE — Patient Instructions (Signed)
Please start vitamin b12 1000 mcg once daily. Available over the counter

## 2023-09-12 ENCOUNTER — Other Ambulatory Visit: Payer: BC Managed Care – PPO

## 2023-09-13 ENCOUNTER — Ambulatory Visit: Payer: BC Managed Care – PPO | Admitting: Oncology

## 2023-09-13 ENCOUNTER — Ambulatory Visit: Payer: BC Managed Care – PPO

## 2023-09-13 ENCOUNTER — Ambulatory Visit: Payer: Self-pay | Admitting: Internal Medicine

## 2023-09-15 ENCOUNTER — Inpatient Hospital Stay: Payer: 59

## 2023-09-15 ENCOUNTER — Inpatient Hospital Stay: Payer: 59 | Attending: Oncology

## 2023-09-15 DIAGNOSIS — E538 Deficiency of other specified B group vitamins: Secondary | ICD-10-CM | POA: Diagnosis present

## 2023-09-15 DIAGNOSIS — E611 Iron deficiency: Secondary | ICD-10-CM

## 2023-09-15 MED ORDER — CYANOCOBALAMIN 1000 MCG/ML IJ SOLN
1000.0000 ug | Freq: Once | INTRAMUSCULAR | Status: AC
  Administered 2023-09-15: 1000 ug via INTRAMUSCULAR
  Filled 2023-09-15: qty 1

## 2023-10-17 ENCOUNTER — Inpatient Hospital Stay: Payer: 59

## 2023-10-20 ENCOUNTER — Inpatient Hospital Stay: Attending: Oncology

## 2023-10-20 DIAGNOSIS — E538 Deficiency of other specified B group vitamins: Secondary | ICD-10-CM | POA: Insufficient documentation

## 2023-10-20 DIAGNOSIS — E611 Iron deficiency: Secondary | ICD-10-CM

## 2023-10-20 MED ORDER — CYANOCOBALAMIN 1000 MCG/ML IJ SOLN
1000.0000 ug | Freq: Once | INTRAMUSCULAR | Status: AC
  Administered 2023-10-20: 1000 ug via INTRAMUSCULAR
  Filled 2023-10-20: qty 1

## 2023-10-25 ENCOUNTER — Encounter: Payer: Self-pay | Admitting: Oncology

## 2023-10-28 ENCOUNTER — Other Ambulatory Visit: Payer: Self-pay

## 2023-10-28 ENCOUNTER — Inpatient Hospital Stay: Payer: 59 | Attending: Oncology

## 2023-10-28 ENCOUNTER — Other Ambulatory Visit: Payer: 59

## 2023-10-28 ENCOUNTER — Encounter: Payer: Self-pay | Admitting: Oncology

## 2023-10-28 DIAGNOSIS — D509 Iron deficiency anemia, unspecified: Secondary | ICD-10-CM | POA: Insufficient documentation

## 2023-10-28 DIAGNOSIS — E538 Deficiency of other specified B group vitamins: Secondary | ICD-10-CM | POA: Insufficient documentation

## 2023-10-28 DIAGNOSIS — E611 Iron deficiency: Secondary | ICD-10-CM

## 2023-10-28 LAB — IRON AND TIBC
Iron: 109 ug/dL (ref 28–170)
Saturation Ratios: 30 % (ref 10.4–31.8)
TIBC: 364 ug/dL (ref 250–450)
UIBC: 255 ug/dL

## 2023-10-28 LAB — CBC WITH DIFFERENTIAL/PLATELET
Abs Immature Granulocytes: 0.01 10*3/uL (ref 0.00–0.07)
Basophils Absolute: 0 10*3/uL (ref 0.0–0.1)
Basophils Relative: 0 %
Eosinophils Absolute: 0.1 10*3/uL (ref 0.0–0.5)
Eosinophils Relative: 1 %
HCT: 42.9 % (ref 36.0–46.0)
Hemoglobin: 14.2 g/dL (ref 12.0–15.0)
Immature Granulocytes: 0 %
Lymphocytes Relative: 23 %
Lymphs Abs: 1.3 10*3/uL (ref 0.7–4.0)
MCH: 31.2 pg (ref 26.0–34.0)
MCHC: 33.1 g/dL (ref 30.0–36.0)
MCV: 94.3 fL (ref 80.0–100.0)
Monocytes Absolute: 0.3 10*3/uL (ref 0.1–1.0)
Monocytes Relative: 5 %
Neutro Abs: 3.9 10*3/uL (ref 1.7–7.7)
Neutrophils Relative %: 71 %
Platelets: 322 10*3/uL (ref 150–400)
RBC: 4.55 MIL/uL (ref 3.87–5.11)
RDW: 12.4 % (ref 11.5–15.5)
WBC: 5.6 10*3/uL (ref 4.0–10.5)
nRBC: 0 % (ref 0.0–0.2)

## 2023-10-28 LAB — FERRITIN: Ferritin: 21 ng/mL (ref 11–307)

## 2023-10-28 LAB — VITAMIN B12: Vitamin B-12: 238 pg/mL (ref 180–914)

## 2023-10-30 LAB — INTRINSIC FACTOR ANTIBODIES: Intrinsic Factor: 21 [AU]/ml — ABNORMAL HIGH (ref 0.0–1.1)

## 2023-10-31 ENCOUNTER — Encounter: Payer: Self-pay | Admitting: Oncology

## 2023-10-31 LAB — ANTI-PARIETAL ANTIBODY: Parietal Cell Antibody-IgG: 171.8 U — ABNORMAL HIGH (ref 0.0–20.0)

## 2023-11-01 ENCOUNTER — Encounter: Payer: Self-pay | Admitting: Internal Medicine

## 2023-11-01 ENCOUNTER — Inpatient Hospital Stay: Payer: 59

## 2023-11-01 ENCOUNTER — Inpatient Hospital Stay (HOSPITAL_BASED_OUTPATIENT_CLINIC_OR_DEPARTMENT_OTHER): Payer: 59 | Admitting: Internal Medicine

## 2023-11-01 ENCOUNTER — Encounter: Payer: Self-pay | Admitting: Oncology

## 2023-11-01 DIAGNOSIS — D510 Vitamin B12 deficiency anemia due to intrinsic factor deficiency: Secondary | ICD-10-CM | POA: Diagnosis not present

## 2023-11-01 DIAGNOSIS — D509 Iron deficiency anemia, unspecified: Secondary | ICD-10-CM | POA: Diagnosis not present

## 2023-11-01 DIAGNOSIS — E611 Iron deficiency: Secondary | ICD-10-CM

## 2023-11-01 DIAGNOSIS — E538 Deficiency of other specified B group vitamins: Secondary | ICD-10-CM

## 2023-11-01 MED ORDER — SYRINGE/NEEDLE (DISP) 22G X 1" 3 ML MISC: 1.0000 | Freq: Once | 0 refills | Status: AC

## 2023-11-01 MED ORDER — CYANOCOBALAMIN 1000 MCG/ML IJ SOLN: 1000.0000 ug | INTRAMUSCULAR | 1 refills | Status: AC

## 2023-11-01 MED ORDER — CYANOCOBALAMIN 1000 MCG/ML IJ SOLN
1000.0000 ug | Freq: Once | INTRAMUSCULAR | Status: AC
  Administered 2023-11-01: 1000 ug via INTRAMUSCULAR
  Filled 2023-11-01: qty 1

## 2023-11-01 MED ORDER — CYANOCOBALAMIN 1000 MCG/ML IJ SOLN: 1000.0000 ug | INTRAMUSCULAR | 1 refills | Status: DC

## 2023-11-01 NOTE — Patient Instructions (Addendum)
 You can consider gentle iron  three times a week.   Consider IM B12 injections once weekly until end of this month then switch to monthly to boost your B12 stores and hopefully bring it above 300.

## 2023-11-01 NOTE — Progress Notes (Signed)
 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 Aris Bel is leaving. No new questions or concerns for the doctor today.

## 2023-11-01 NOTE — Progress Notes (Addendum)
 Tracy Rice  [phone] [phone]  ID: Tracy Rice OB: 07/05/1980  MR#: [id number]  WUX#:[id number]  Patient Care Team: [facility] as PCP - General Traeh Milroy, MD as Consulting Physician (Oncology)  Reason for visit - IDA, B12 deficiency  HPI: Tracy Rice is a 43 y.o. female with no significant past medical history follows with hematology for iron  deficiency anemia and B12 deficiency.  She has history of iron  deficiency for several years.  Has history of C-section. Reports fatigue which in turn causes bone pain for the past 2 years. Chronic diarrhea for several years.  Colonoscopy from 04/2021 showed 5 sessile 2 to 5 mm polyp in the transverse ascending and cecum.  Pathology showed tubular adenoma.  Follows with Dr. Shella Devoid, GI with [facility]. Has history of heavy menstrual cycles.   Of chart, received IV Venofer  200 mg x 2 doses in November 2024. On B12 injections monthly.  Interval history Patient was seen today as a follow-up for iron  deficiency, B12 deficiency and labs. Reports improvement in fatigue level however not completely resolved.   On birth control.  Last menstrual cycle was 3 months ago.   REVIEW OF SYSTEMS:   Review of Systems  Constitutional:  Positive for malaise/fatigue.       Bone pain    As per HPI. Otherwise, a complete review of systems is negative.  PAST MEDICAL HISTORY: History reviewed. No pertinent past medical history.  PAST SURGICAL HISTORY: Past Surgical History:  Procedure Laterality Date   CESAREAN SECTION     TUBAL LIGATION      FAMILY HISTORY: Family History  Problem Relation Age of Onset   Cancer Mother        Breast cancer    HEALTH MAINTENANCE: Social History   Tobacco Use   Smoking status: Never   Smokeless tobacco: Never  Substance Use Topics   Alcohol use: No   Drug use: Never     Allergies  Allergen Reactions   Povidone-Iodine Hives   Chlorhexidine  Gluconate Dermatitis    Significant rash and puritis   Betadine [Povidone Iodine] Hives   Sulfa Antibiotics Other (See Comments)    Thrush    Current Outpatient Medications  Medication Sig Dispense Refill   SYRINGE-NEEDLE, DISP, 3 ML 22G X 1" 3 ML MISC 1 each by Does not apply route once for 1 dose. 12 each 0   traZODone (DESYREL) 100 MG tablet Take 100 mg by mouth at bedtime.     venlafaxine XR (EFFEXOR-XR) 150 MG 24 hr capsule Take 150 mg by mouth daily.     cyanocobalamin  (VITAMIN B12) 1000 MCG/ML injection Inject 1 mL (1,000 mcg total) into the muscle once a week. Once weekly x 6 weeks, then proceed with monthly B12 injections x 6 months 6 mL 1   No current facility-administered medications for this visit.   Facility-Administered Medications Ordered in Other Visits  Medication Dose Route Frequency Provider Last Rate Last Admin   cyanocobalamin  (VITAMIN B12) injection 1,000 mcg  1,000 mcg Intramuscular Once Finnegan, Timothy J, MD        OBJECTIVE: There were no vitals filed for this visit.    There is no height or weight on file to calculate BMI.      General: Well-developed, well-nourished, no acute distress. Eyes: Pink conjunctiva, anicteric sclera. HEENT: Normocephalic, moist mucous membranes, clear oropharnyx. Lungs: Clear to auscultation bilaterally. Heart: Regular rate and rhythm. No rubs, murmurs, or gallops. Abdomen: Soft, nontender, nondistended.  No organomegaly noted, normoactive bowel sounds. Musculoskeletal: No edema, cyanosis, or clubbing. Neuro: Alert, answering all questions appropriately. Cranial nerves grossly intact. Skin: No rashes or petechiae noted. Psych: Normal affect. Lymphatics: No cervical, calvicular, axillary or inguinal LAD.   LAB RESULTS:  Lab Results  Component Value Date   NA 138 07/06/2020   K 4.2 07/06/2020   CL 102 07/06/2020   CO2 24 07/06/2020   GLUCOSE 90 07/06/2020   BUN 9 07/06/2020   CREATININE 0.69 07/06/2020   CALCIUM 9.4  07/06/2020   PROT 7.4 04/01/2014   ALBUMIN 4.0 04/01/2014   AST 22 04/01/2014   ALT 35 04/01/2014   ALKPHOS 119 (H) 04/01/2014   BILITOT 0.8 04/01/2014   GFRNONAA >60 07/06/2020   GFRAA >60 11/13/2019    Lab Results  Component Value Date   WBC 5.6 10/28/2023   NEUTROABS 3.9 10/28/2023   HGB 14.2 10/28/2023   HCT 42.9 10/28/2023   MCV 94.3 10/28/2023   PLT 322 10/28/2023    Lab Results  Component Value Date   TIBC 364 10/28/2023   TIBC 337 08/16/2023   TIBC 403 05/10/2023   FERRITIN 21 10/28/2023   FERRITIN 28 08/16/2023   FERRITIN 10 (L) 05/10/2023   IRONPCTSAT 30 10/28/2023   IRONPCTSAT 53 (H) 08/16/2023   IRONPCTSAT 17 05/10/2023     STUDIES: No results found.  ASSESSMENT AND PLAN:   SINDEL Rice is a 43 y.o. female with pmh of no significant past medical history follows with hematology for iron  deficiency anemia and B12 deficiency.  # Iron  deficiency anemia -Could be related to heavy menstrual cycle.  Last colonoscopy was from November 2022 by Dr. Shella Devoid at [facility] which showed polyps.  Path tubular adenoma as above.   -Received IV Venofer  200 mg x 2 doses in November 2024. -Hemoglobin is normal at 14.2.  Iron  stores normal.  Hold off on IV iron  infusion.  Can consider gentle iron  3 times a week.  # Vitamin B12 deficiency # Pernicious anemia -Celiac panel negative.  Intrinsic factor antibody elevated to 21.  Antiparietal cell antibody elevated to 171.8.  Initially, B12 was significantly low at 105.  -Considering above labs, it raises concern for pernicious anemia.  Discussed about autoimmune nature of the disease, pathology, malabsorption with vitamin B12, atrophic gastritis, increased risk of gastric malignancies and the need for upper endoscopy for surveillance.  She follows with Dr. Shella Devoid, GI at [facility] and I have left a message with his team.  Patient will also reach out.  - She will need lifelong vitamin B12 supplementation.  B12 has improved to 238 after  monthly injections.  Discussed about doing IM B12 weekly x 4 to boost the B12 stores and to improve level above 300 and then can go to monthly B12 injections.  She would like to administer at home.  Will do teach today.  Send supplies for B12 injections for 6 months.  She works at [employer] and would like to transfer all her care there.  Her primary has already made referral to [facility] hematology.  # Chronic diarrhea - Chronic diarrhea for several years. Colonoscopy from 04/2021 showed 5 sessile 2 to 5 mm polyp in the transverse ascending and cecum.  Pathology showed tubular adenoma.  - Continue follow-up with [facility].  Was started on cholestyramine   # Fatigue # Bone pain - Improved. Continue with vitamin B12 supplementation.  - Vitamin D level was normal. - Referral to rheumatology was placed last visit with  persistent symptoms of fatigue/bone pain.   No orders of the defined types were placed in this encounter.  RTC as needed  Patient expressed understanding and was in agreement with this plan. She also understands that She can call clinic at any time with any questions, concerns, or complaints.   I spent a total of 25 minutes reviewing chart data, face-to-face evaluation with the patient, counseling and coordination of care as detailed above.  Loreatha Rodney, MD   11/01/2023 2:38 PM

## 2023-11-08 ENCOUNTER — Telehealth: Payer: Self-pay | Admitting: *Deleted

## 2023-11-08 NOTE — Telephone Encounter (Signed)
-----   Message from Tracy Rice sent at 11/07/2023 12:04 PM EDT ----- Regarding: my chart Please just update her on mychart message -   Okay to stop oral vitamin b12. Since we are doing 4 weekly injections then continuing with monthly shots.   Thanks

## 2023-11-08 NOTE — Telephone Encounter (Signed)
 Spoke with patient. Pt gave verbal understanding to hold the oral b12 while on the b12 injections.

## 2023-11-14 ENCOUNTER — Encounter: Payer: Self-pay | Admitting: Internal Medicine

## 2023-11-14 ENCOUNTER — Encounter: Payer: Self-pay | Admitting: Emergency Medicine

## 2023-11-14 ENCOUNTER — Ambulatory Visit
Admission: EM | Admit: 2023-11-14 | Discharge: 2023-11-14 | Disposition: A | Discharge status: Home / Self Care | Attending: Emergency Medicine | Admitting: Emergency Medicine

## 2023-11-14 DIAGNOSIS — N3001 Acute cystitis with hematuria: Secondary | ICD-10-CM | POA: Diagnosis present

## 2023-11-14 DIAGNOSIS — R350 Frequency of micturition: Secondary | ICD-10-CM | POA: Insufficient documentation

## 2023-11-14 HISTORY — DX: Interstitial cystitis (chronic) without hematuria: N30.10

## 2023-11-14 HISTORY — DX: Deficiency of other specified B group vitamins: E53.8

## 2023-11-14 LAB — POCT URINALYSIS DIP (MANUAL ENTRY)
Glucose, UA: NEGATIVE mg/dL
Nitrite, UA: POSITIVE — AB
Protein Ur, POC: >=300 mg/dL — AB
Spec Grav, UA: 1.02
Urobilinogen, UA: 1 U/dL
pH, UA: 6

## 2023-11-14 MED ORDER — CEPHALEXIN 500 MG PO CAPS: 500.0000 mg | ORAL_CAPSULE | Freq: Three times a day (TID) | ORAL | 0 refills | Status: AC

## 2023-11-14 MED ORDER — FLUCONAZOLE 150 MG PO TABS
150.0000 mg | ORAL_TABLET | ORAL | 0 refills | Status: AC | PRN
Start: 2023-11-14 — End: ?

## 2023-11-14 NOTE — ED Provider Notes (Signed)
 Tracy Rice    CSN: [id number] Arrival date & time: 11/14/23  4132      History   Chief Complaint Chief Complaint  Patient presents with   Back Pain    HPI Tracy Rice is a 43 y.o. female.   Patient presents for evaluation of lower back pain, lower abdominal pain, urinary frequency, urgency, hematuria present for 4 to 5 days.  Has taken ibuprofen, has Uristat available but is not taking due to wanting to complete testing first.  Denies fever and dysuria.  Endorses missing 2 birth control pills due to malaise and unsure if the bleeding is related.  History of interstitial cystitis  Past Medical History:  Diagnosis Date   B12 deficiency    Interstitial cystitis     Patient Active Problem List   Diagnosis Date Noted   Pernicious anemia 11/01/2023   B12 deficiency 08/18/2023   Chronic diarrhea 08/18/2023   Fatigue 08/18/2023   Iron  deficiency 05/10/2023    Past Surgical History:  Procedure Laterality Date   CESAREAN SECTION     TUBAL LIGATION      OB History   No obstetric history on file.      Home Medications    Prior to Admission medications   Medication Sig Start Date End Date Taking? Authorizing Provider  cephALEXin  (KEFLEX ) 500 MG capsule Take 1 capsule (500 mg total) by mouth 3 (three) times daily for 5 days. 11/14/23 11/19/23 Yes Aleria Maheu R, NP  fluconazole  (DIFLUCAN ) 150 MG tablet Take 1 tablet (150 mg total) by mouth every three (3) days as needed for up to 2 doses. 11/14/23  Yes Jayliani Wanner R, NP  cyanocobalamin  (VITAMIN B12) 1000 MCG/ML injection Inject 1 mL (1,000 mcg total) into the muscle once a week. Once weekly x 6 weeks, then proceed with monthly B12 injections x 6 months 11/01/23   Agrawal, Kavita, MD  traZODone (DESYREL) 100 MG tablet Take 100 mg by mouth at bedtime.    [provider]  venlafaxine XR (EFFEXOR-XR) 150 MG 24 hr capsule Take 150 mg by mouth daily. 04/18/23   [provider]    Family  History Family History  Problem Relation Age of Onset   Cancer Mother        Breast cancer    Social History Social History   Tobacco Use   Smoking status: Never   Smokeless tobacco: Never  Substance Use Topics   Alcohol use: No   Drug use: Never     Allergies   Povidone-iodine, Chlorhexidine gluconate, Betadine [povidone iodine], and Sulfa antibiotics   Review of Systems Review of Systems   Physical Exam Triage Vital Signs ED Triage Vitals  Encounter Vitals Group     BP 11/14/23 0825 116/81     Systolic BP Percentile --      Diastolic BP Percentile --      Pulse Rate 11/14/23 0825 82     Resp 11/14/23 0825 17     Temp 11/14/23 0825 98.1 F (36.7 C)     Temp Source 11/14/23 0825 Oral     SpO2 11/14/23 0825 98 %     Weight --      Height --      Head Circumference --      Peak Flow --      Pain Score 11/14/23 0823 6     Pain Loc --      Pain Education --      Exclude from [employer]  Chart --    No data found.  Updated Vital Signs BP 116/81 (BP Location: Left Arm)   Pulse 82   Temp 98.1 F (36.7 C) (Oral)   Resp 17   LMP 11/04/2023 (Approximate)   SpO2 98%   Visual Acuity Right Eye Distance:   Left Eye Distance:   Bilateral Distance:    Right Eye Near:   Left Eye Near:    Bilateral Near:     Physical Exam Constitutional:      Appearance: Normal appearance.  Eyes:     Extraocular Movements: Extraocular movements intact.  Pulmonary:     Effort: Pulmonary effort is normal.  Abdominal:     General: Abdomen is flat. Bowel sounds are normal.     Palpations: Abdomen is soft.     Tenderness: There is abdominal tenderness in the suprapubic area. There is left CVA tenderness.  Neurological:     Mental Status: She is alert and oriented to person, place, and time. Mental status is at baseline.      UC Treatments / Results  Labs (all labs ordered are listed, but only abnormal results are displayed) Labs Reviewed  POCT URINALYSIS DIP (MANUAL  ENTRY) - Abnormal; Notable for the following components:      Result Value   Color, UA red (*)    Clarity, UA hazy (*)    Bilirubin, UA small (*)    Ketones, POC UA trace (5) (*)    Blood, UA large (*)    Protein Ur, POC >=300 (*)    Nitrite, UA Positive (*)    Leukocytes, UA Trace (*)    All other components within normal limits  URINE CULTURE    EKG   Radiology No results found.  Procedures Procedures (including critical care time)  Medications Ordered in UC Medications - No data to display  Initial Impression / Assessment and Plan / UC Course  I have reviewed the triage vital signs and the nursing notes.  Pertinent labs & imaging results that were available during my care of the patient were reviewed by me and considered in my medical decision making (see chart for details).  Acute cystitis with hematuria, urinary frequency  Urinalysis showing nitrates and leukocytes, sent for culture, prescribed cephalexin  and recommended supportive care and advised follow-up if symptoms continue to persist Final Clinical Impressions(s) / UC Diagnoses   Final diagnoses:  Urinary frequency  Acute cystitis with hematuria   Discharge Instructions      Your urinalysis shows Tracy Rice blood cells and nitrates which are indicative of infection, your urine will be sent to the lab to determine exactly which bacteria is present, if any changes need to be made to your medications you will be notified  Begin use of cephalexin  every 8 hours for 5 days  You may use over-the-counter Azo to help minimize your symptoms until antibiotic removes bacteria, this medication will turn your urine orange  Increase your fluid intake through use of water  As always practice good hygiene, wiping front to back and avoidance of scented vaginal products to prevent further irritation  If symptoms continue to persist after use of medication or recur please follow-up with urgent care or your primary doctor as  needed   ED Prescriptions     Medication Sig Dispense Auth. Provider   cephALEXin  (KEFLEX ) 500 MG capsule Take 1 capsule (500 mg total) by mouth 3 (three) times daily for 5 days. 15 capsule Anjolina Byrer R, NP   fluconazole  (DIFLUCAN )  150 MG tablet Take 1 tablet (150 mg total) by mouth every three (3) days as needed for up to 2 doses. 2 tablet Xandria Gallaga R, NP      PDMP not reviewed this encounter.   Reena Canning, [place] 11/14/23 [phone]

## 2023-11-14 NOTE — Discharge Instructions (Addendum)
Your urinalysis shows Tracy Rice blood cells and nitrates which are indicative of infection, your urine will be sent to the lab to determine exactly which bacteria is present, if any changes need to be made to your medications you will be notified  Begin use of cephalexin every 8 hours for 5 days  You may use over-the-counter Azo to help minimize your symptoms until antibiotic removes bacteria, this medication will turn your urine orange  Increase your fluid intake through use of water  As always practice good hygiene, wiping front to back and avoidance of scented vaginal products to prevent further irritation  If symptoms continue to persist after use of medication or recur please follow-up with urgent care or your primary doctor as needed

## 2023-11-14 NOTE — ED Triage Notes (Signed)
 Pt having lowe back pain and pressure, urgency with urination for 4-5 days. Hx interstitial cystitis so was waiting to see if flare up. Pt took ibuprofen

## 2023-11-15 LAB — URINE CULTURE

## 2023-11-17 ENCOUNTER — Ambulatory Visit (HOSPITAL_COMMUNITY): Payer: Self-pay

## 2023-11-17 ENCOUNTER — Ambulatory Visit
Admission: EM | Admit: 2023-11-17 | Discharge: 2023-11-17 | Disposition: A | Discharge status: Home / Self Care | Attending: Emergency Medicine | Admitting: Emergency Medicine

## 2023-11-17 DIAGNOSIS — R300 Dysuria: Secondary | ICD-10-CM | POA: Diagnosis present

## 2023-11-17 LAB — POCT URINALYSIS DIP (MANUAL ENTRY)
Bilirubin, UA: NEGATIVE
Blood, UA: NEGATIVE
Glucose, UA: NEGATIVE mg/dL
Ketones, POC UA: NEGATIVE mg/dL
Leukocytes, UA: NEGATIVE
Nitrite, UA: NEGATIVE
Protein Ur, POC: NEGATIVE mg/dL
Spec Grav, UA: <=1.005 — AB (ref 1.010–1.025)
Urobilinogen, UA: 0.2 U/dL
pH, UA: 6.5 (ref 5.0–8.0)

## 2023-11-17 LAB — POCT URINE PREGNANCY: Preg Test, Ur: NEGATIVE

## 2023-11-17 NOTE — ED Provider Notes (Signed)
 [name]    CSN: [id number] Arrival date & time: 11/17/23  1856      History   Chief Complaint Chief Complaint  Patient presents with   Urinary Frequency    HPI KATEY BARRIE is a 43 y.o. female.  Patient presents with ongoing dysuria, urinary frequency, bladder pressure, low back pain.  She is currently on cephalexin  for a UTI.  Her medical history includes interstitial cystitis.  She denies fever, abdominal pain, hematuria.  She was seen at this urgent care on 11/14/2023; diagnosed with urinary frequency and acute cystitis with hematuria; treated with cephalexin  and Diflucan .  Her urine culture came back with multiple species and recollection was suggested.  The history is provided by the patient and medical records.    Past Medical History:  Diagnosis Date   B12 deficiency    Interstitial cystitis     Patient Active Problem List   Diagnosis Date Noted   Pernicious anemia 11/01/2023   B12 deficiency 08/18/2023   Chronic diarrhea 08/18/2023   Fatigue 08/18/2023   Iron  deficiency 05/10/2023    Past Surgical History:  Procedure Laterality Date   CESAREAN SECTION     TUBAL LIGATION      OB History   No obstetric history on file.      Home Medications    Prior to Admission medications   Medication Sig Start Date End Date Taking? Authorizing Provider  JUNEL FE 1.5/30 1.5-30 MG-MCG tablet Take 1 tablet by mouth daily. 11/02/23  Yes [provider]  cephALEXin  (KEFLEX ) 500 MG capsule Take 1 capsule (500 mg total) by mouth 3 (three) times daily for 5 days. 11/14/23 11/19/23  Reena Canning, NP  cyanocobalamin  (VITAMIN B12) 1000 MCG/ML injection Inject 1 mL (1,000 mcg total) into the muscle once a week. Once weekly x 6 weeks, then proceed with monthly B12 injections x 6 months 11/01/23   Agrawal, Kavita, MD  fluconazole  (DIFLUCAN ) 150 MG tablet Take 1 tablet (150 mg total) by mouth every three (3) days as needed for up to 2 doses. 11/14/23   Reena Canning, NP  traZODone (DESYREL) 100 MG tablet Take 100 mg by mouth at bedtime.    [provider]  venlafaxine XR (EFFEXOR-XR) 150 MG 24 hr capsule Take 150 mg by mouth daily. 04/18/23   [provider]    Family History Family History  Problem Relation Age of Onset   Cancer Mother        Breast cancer    Social History Social History   Tobacco Use   Smoking status: Never   Smokeless tobacco: Never  Substance Use Topics   Alcohol use: No   Drug use: Never     Allergies   Povidone-iodine, Chlorhexidine gluconate, Betadine [povidone iodine], and Sulfa antibiotics   Review of Systems Review of Systems  Constitutional:  Negative for chills and fever.  Gastrointestinal:  Negative for abdominal pain.  Genitourinary:  Positive for dysuria and frequency. Negative for flank pain and hematuria.  Musculoskeletal:  Positive for back pain. Negative for gait problem.     Physical Exam Triage Vital Signs ED Triage Vitals  Encounter Vitals Group     BP 11/17/23 1919 (!) 140/78     Systolic BP Percentile --      Diastolic BP Percentile --      Pulse Rate 11/17/23 1919 68     Resp 11/17/23 1919 16     Temp 11/17/23 1919 98 F (36.7 C)  Temp src --      SpO2 11/17/23 1919 98 %     Weight --      Height --      Head Circumference --      Peak Flow --      Pain Score 11/17/23 1915 5     Pain Loc --      Pain Education --      Exclude from Growth Chart --    No data found.  Updated Vital Signs BP (!) 140/78   Pulse 68   Temp 98 F (36.7 C)   Resp 16   LMP 11/04/2023 (Approximate)   SpO2 98%   Visual Acuity Right Eye Distance:   Left Eye Distance:   Bilateral Distance:    Right Eye Near:   Left Eye Near:    Bilateral Near:     Physical Exam Constitutional:      General: She is not in acute distress. HENT:     Mouth/Throat:     Mouth: Mucous membranes are moist.  Cardiovascular:     Rate and Rhythm: Normal rate and regular rhythm.   Pulmonary:     Effort: Pulmonary effort is normal. No respiratory distress.  Abdominal:     General: Bowel sounds are normal.     Palpations: Abdomen is soft.     Tenderness: There is no abdominal tenderness. There is no right CVA tenderness, left CVA tenderness, guarding or rebound.  Neurological:     Mental Status: She is alert.      UC Treatments / Results  Labs (all labs ordered are listed, but only abnormal results are displayed) Labs Reviewed  POCT URINALYSIS DIP (MANUAL ENTRY) - Abnormal; Notable for the following components:      Result Value   Spec Grav, UA <=1.005 (*)    All other components within normal limits  URINE CULTURE  POCT URINE PREGNANCY    EKG   Radiology No results found.  Procedures Procedures (including critical care time)  Medications Ordered in UC Medications - No data to display  Initial Impression / Assessment and Plan / UC Course  I have reviewed the triage vital signs and the nursing notes.  Pertinent labs & imaging results that were available during my care of the patient were reviewed by me and considered in my medical decision making (see chart for details).    Dysuria.  Patient is currently on cephalexin .  Her urine is markedly improved from previous on 11/14/2023.  Her urine culture came back with multiple species and recollection suggested.  Urine culture sent today as patient is still symptomatic.  Instructed her to continue the cephalexin  as directed.  Instructed her to follow-up with her PCP if she is not improving.  Education provided on dysuria.  She agrees to plan of care.  Final Clinical Impressions(s) / UC Diagnoses   Final diagnoses:  Dysuria     Discharge Instructions      We are resending a culture of your urine today.  Continue your current treatment and follow-up with your primary care provider if you are not improving.   ED Prescriptions   None    PDMP not reviewed this encounter.   Wellington Half,  NP 11/17/23 [phone]

## 2023-11-17 NOTE — Discharge Instructions (Addendum)
 We are resending a culture of your urine today.  Continue your current treatment and follow-up with your primary care provider if you are not improving.

## 2023-11-17 NOTE — ED Triage Notes (Signed)
 Patient to Urgent Care with complaints of persistent UTI symptoms- urinary urgency/ frequency/ lower back pain/ suprapubic pressure. Denies any fevers.  Taking cephalexin  prescribed 5/19 w/o any improvement. Symptoms x1 week.   Hx of interstitial cystitis.

## 2023-11-18 LAB — URINE CULTURE: Culture: NO GROWTH

## 2023-11-21 ENCOUNTER — Ambulatory Visit (HOSPITAL_COMMUNITY): Payer: Self-pay

## 2024-02-28 ENCOUNTER — Ambulatory Visit
Admission: EM | Admit: 2024-02-28 | Discharge: 2024-02-28 | Disposition: A | Discharge status: Home / Self Care | Attending: Emergency Medicine | Admitting: Emergency Medicine

## 2024-02-28 DIAGNOSIS — R1030 Lower abdominal pain, unspecified: Secondary | ICD-10-CM

## 2024-02-28 DIAGNOSIS — R319 Hematuria, unspecified: Secondary | ICD-10-CM | POA: Diagnosis present

## 2024-02-28 DIAGNOSIS — R300 Dysuria: Secondary | ICD-10-CM | POA: Diagnosis present

## 2024-02-28 LAB — POCT URINE DIPSTICK
Bilirubin, UA: NEGATIVE
Glucose, UA: NEGATIVE mg/dL
Ketones, POC UA: NEGATIVE mg/dL
Nitrite, UA: NEGATIVE
Protein Ur, POC: NEGATIVE mg/dL
Spec Grav, UA: <=1.005 — AB (ref 1.010–1.025)
Urobilinogen, UA: 0.2 U/dL
pH, UA: 6 (ref 5.0–8.0)

## 2024-02-28 LAB — POCT URINE PREGNANCY: Preg Test, Ur: NEGATIVE

## 2024-02-28 MED ORDER — CEPHALEXIN 500 MG PO CAPS
500.0000 mg | ORAL_CAPSULE | Freq: Three times a day (TID) | ORAL | 0 refills | Status: AC
Start: 2024-02-28 — End: 2024-03-04

## 2024-02-28 NOTE — Discharge Instructions (Addendum)
 Take the antibiotic as directed.  The urine culture is pending.  We will call you if it shows the need to change or discontinue your antibiotic.    Follow up with your primary care provider.  Go to the emergency department if you have worsening symptoms.

## 2024-02-28 NOTE — ED Triage Notes (Signed)
 Patient to Urgent Care with complaints of suprapubic pressure/ hematuria/ dysuria.   Symptoms x3 days.   Taking azo/ pushing fluids.

## 2024-02-28 NOTE — ED Provider Notes (Signed)
 [facility]AL[facility]ELT    CSN: [id number] Arrival date & time: 02/28/24  1822      History   Chief Complaint Chief Complaint  Patient presents with   Urinary Frequency    HPI Tracy Rice is a 43 y.o. female.  Patient presents with 3-day history of dysuria and bladder pressure.  She noted blood on the tissue when she wiped today after urinating.  She has been treating her symptoms with Azo; last dose taken this morning.  She denies fever, chills, vaginal discharge, pelvic pain.  The history is provided by the patient and medical records.    Past Medical History:  Diagnosis Date   B12 deficiency    Interstitial cystitis     Patient Active Problem List   Diagnosis Date Noted   Pernicious anemia 11/01/2023   B12 deficiency 08/18/2023   Chronic diarrhea 08/18/2023   Fatigue 08/18/2023   Iron  deficiency 05/10/2023    Past Surgical History:  Procedure Laterality Date   CESAREAN SECTION     TUBAL LIGATION      OB History   No obstetric history on file.      Home Medications    Prior to Admission medications   Medication Sig Start Date End Date Taking? Authorizing Provider  cephALEXin  (KEFLEX ) 500 MG capsule Take 1 capsule (500 mg total) by mouth 3 (three) times daily for 5 days. 02/28/24 03/04/24 Yes Corlis Burnard DEL, NP  cyanocobalamin  (VITAMIN B12) 1000 MCG/ML injection Inject 1 mL (1,000 mcg total) into the muscle once a week. Once weekly x 6 weeks, then proceed with monthly B12 injections x 6 months 11/01/23   Agrawal, Kavita, MD  fluconazole  (DIFLUCAN ) 150 MG tablet Take 1 tablet (150 mg total) by mouth every three (3) days as needed for up to 2 doses. Patient not taking: Reported on 02/28/2024 11/14/23   Teresa Shelba SAUNDERS, NP  JUNEL FE 1.5/30 1.5-30 MG-MCG tablet Take 1 tablet by mouth daily. 11/02/23   [provider]  traZODone (DESYREL) 100 MG tablet Take 100 mg by mouth at bedtime.    [provider]  venlafaxine XR (EFFEXOR-XR) 150 MG 24 hr capsule  Take 150 mg by mouth daily. 04/18/23   [provider]    Family History Family History  Problem Relation Age of Onset   Cancer Mother        Breast cancer    Social History Social History   Tobacco Use   Smoking status: Never   Smokeless tobacco: Never  Substance Use Topics   Alcohol use: No   Drug use: Never     Allergies   Povidone-iodine, Chlorhexidine gluconate, Betadine [povidone iodine], and Sulfa antibiotics   Review of Systems Review of Systems  Constitutional:  Negative for chills and fever.  Gastrointestinal:  Positive for abdominal pain. Negative for diarrhea, nausea and vomiting.  Genitourinary:  Positive for dysuria and hematuria. Negative for flank pain, pelvic pain and vaginal discharge.     Physical Exam Triage Vital Signs ED Triage Vitals  Encounter Vitals Group     BP      Girls Systolic BP Percentile      Girls Diastolic BP Percentile      Boys Systolic BP Percentile      Boys Diastolic BP Percentile      Pulse      Resp      Temp      Temp src      SpO2  Weight      Height      Head Circumference      Peak Flow      Pain Score      Pain Loc      Pain Education      Exclude from Growth Chart    No data found.  Updated Vital Signs BP 136/87   Pulse 71   Temp 98 F (36.7 C)   Resp 19   SpO2 98%   Visual Acuity Right Eye Distance:   Left Eye Distance:   Bilateral Distance:    Right Eye Near:   Left Eye Near:    Bilateral Near:     Physical Exam Constitutional:      General: She is not in acute distress. HENT:     Mouth/Throat:     Mouth: Mucous membranes are moist.  Cardiovascular:     Rate and Rhythm: Normal rate and regular rhythm.  Pulmonary:     Effort: Pulmonary effort is normal. No respiratory distress.  Abdominal:     General: Bowel sounds are normal.     Palpations: Abdomen is soft.     Tenderness: There is no abdominal tenderness. There is no right CVA tenderness, left CVA tenderness,  guarding or rebound.  Neurological:     Mental Status: She is alert.      UC Treatments / Results  Labs (all labs ordered are listed, but only abnormal results are displayed) Labs Reviewed  POCT URINE DIPSTICK - Abnormal; Notable for the following components:      Result Value   Color, UA light yellow (*)    Spec Grav, UA <=1.005 (*)    Blood, UA moderate (*)    Leukocytes, UA Small (1+) (*)    All other components within normal limits  POCT URINE PREGNANCY - Normal  URINE CULTURE    EKG   Radiology No results found.  Procedures Procedures (including critical care time)  Medications Ordered in UC Medications - No data to display  Initial Impression / Assessment and Plan / UC Course  I have reviewed the triage vital signs and the nursing notes.  Pertinent labs & imaging results that were available during my care of the patient were reviewed by me and considered in my medical decision making (see chart for details).    Dysuria, hematuria, lower abdominal pain.  Afebrile and vital signs are stable.  Abdomen is soft and nontender.  No CVAT.  Treating with Keflex . Urine culture pending. Discussed with patient that we will call her if the urine culture shows the need to change or discontinue the antibiotic. Instructed her to follow-up with her PCP.  ED precautions given.  Education provided on dysuria, hematuria, abdominal pain.  Patient agrees to plan of care.     Final Clinical Impressions(s) / UC Diagnoses   Final diagnoses:  Dysuria  Hematuria, unspecified type  Lower abdominal pain     Discharge Instructions      Take the antibiotic as directed.  The urine culture is pending.  We will call you if it shows the need to change or discontinue your antibiotic.    Follow up with your primary care provider.  Go to the emergency department if you have worsening symptoms.        ED Prescriptions     Medication Sig Dispense Auth. Provider   cephALEXin  (KEFLEX )  500 MG capsule Take 1 capsule (500 mg total) by mouth 3 (three) times daily for 5  days. 15 capsule Corlis Burnard DEL, NP      PDMP not reviewed this encounter.   Corlis Burnard DEL, NP 02/28/24 [phone]

## 2024-03-01 ENCOUNTER — Ambulatory Visit (HOSPITAL_COMMUNITY): Payer: Self-pay

## 2024-03-01 LAB — URINE CULTURE: Culture: 30000 — AB

## 2024-04-11 ENCOUNTER — Other Ambulatory Visit: Payer: Self-pay

## 2024-04-11 DIAGNOSIS — Z30431 Encounter for routine checking of intrauterine contraceptive device: Secondary | ICD-10-CM | POA: Insufficient documentation

## 2024-04-11 DIAGNOSIS — N3000 Acute cystitis without hematuria: Secondary | ICD-10-CM | POA: Diagnosis not present

## 2024-04-11 DIAGNOSIS — R102 Pelvic and perineal pain unspecified side: Secondary | ICD-10-CM | POA: Diagnosis present

## 2024-04-11 NOTE — ED Triage Notes (Signed)
 Pt reports she had the mirena IUD placed today and since has been having pain in her vagina and reports it also feels swollen.

## 2024-04-12 ENCOUNTER — Emergency Department
Admission: EM | Admit: 2024-04-12 | Discharge: 2024-04-12 | Disposition: A | Discharge status: Home / Self Care | Attending: Emergency Medicine | Admitting: Emergency Medicine

## 2024-04-12 DIAGNOSIS — Z30431 Encounter for routine checking of intrauterine contraceptive device: Secondary | ICD-10-CM

## 2024-04-12 DIAGNOSIS — N3000 Acute cystitis without hematuria: Secondary | ICD-10-CM

## 2024-04-12 LAB — URINALYSIS, ROUTINE W REFLEX MICROSCOPIC
Bilirubin Urine: NEGATIVE
Glucose, UA: NEGATIVE mg/dL
Ketones, ur: NEGATIVE mg/dL
Nitrite: NEGATIVE
Protein, ur: 100 mg/dL — AB
RBC / HPF: >50 RBC/hpf (ref 0–5)
Specific Gravity, Urine: 1.008 (ref 1.005–1.030)
WBC, UA: >50 WBC/hpf (ref 0–5)
pH: 6 (ref 5.0–8.0)

## 2024-04-12 MED ORDER — KETOROLAC TROMETHAMINE 30 MG/ML IJ SOLN
30.0000 mg | Freq: Once | INTRAMUSCULAR | Status: AC
  Administered 2024-04-12: 30 mg via INTRAMUSCULAR
  Filled 2024-04-12: qty 1

## 2024-04-12 MED ORDER — CEPHALEXIN 500 MG PO CAPS: 500.0000 mg | ORAL_CAPSULE | Freq: Three times a day (TID) | ORAL | 0 refills | Status: AC

## 2024-04-12 MED ORDER — CEPHALEXIN 500 MG PO CAPS
500.0000 mg | ORAL_CAPSULE | Freq: Once | ORAL | Status: AC
  Administered 2024-04-12: 500 mg via ORAL
  Filled 2024-04-12: qty 1

## 2024-04-12 MED ORDER — ACETAMINOPHEN 500 MG PO TABS
1000.0000 mg | ORAL_TABLET | Freq: Once | ORAL | Status: AC
  Administered 2024-04-12: 1000 mg via ORAL
  Filled 2024-04-12: qty 2

## 2024-04-12 MED ORDER — KETOROLAC TROMETHAMINE 15 MG/ML IJ SOLN: 15.0000 mg | Freq: Once | INTRAMUSCULAR | Status: DC

## 2024-04-12 NOTE — Discharge Instructions (Signed)
 We discussed starting antibiotics for concerns for UTI and return to the ER if you develop fevers, worsening pain or any other concerns.  However at this time I suspect that some of this is related to the IUD just having been placed I did see the strings.  We discussed further workup with ultrasound but at this time we have opted to hold off given strings were seen in place.  You should call your OB/GYN to make a follow-up appointment to have it reevaluated if you continue to have discomfort in 1 to 2 days or return to the ER for recheck here.

## 2024-04-12 NOTE — ED Provider Notes (Signed)
 [facility] Provider Note    Event Date/Time   First MD Initiated Contact with Patient 04/12/24 0019     (approximate)   History   Vaginal Pain   HPI  Tracy Rice is a 43 y.o. female G2 P2-0-0-2 who comes in with concerns for vaginal pain.  Patient reports having IUD placed earlier today.  I did review the note from where patient had IUD placed without any issues.  Patient reports after having it placed she felt like a lot of discomfort and swelling in her vagina.  She felt like her uterus was coming out of her vagina and she felt a lot of discomfort with urination.  She denies any pain in her abdomen.   Physical Exam   Triage Vital Signs: ED Triage Vitals  Encounter Vitals Group     BP 04/11/24 2145 (!) 163/108     Girls Systolic BP Percentile --      Girls Diastolic BP Percentile --      Boys Systolic BP Percentile --      Boys Diastolic BP Percentile --      Pulse Rate 04/11/24 2145 (!) 110     Resp 04/11/24 2145 19     Temp 04/11/24 2145 99.2 F (37.3 C)     Temp Source 04/11/24 2145 Oral     SpO2 04/11/24 2145 100 %     Weight 04/11/24 2144 190 lb (86.2 kg)     Height 04/11/24 2144 5' 4 (1.626 m)     Head Circumference --      Peak Flow --      Pain Score 04/11/24 2144 8     Pain Loc --      Pain Education --      Exclude from Growth Chart --     Most recent vital signs: Vitals:   04/12/24 0015 04/12/24 0020  BP: 136/82   Pulse: 81   Resp: 18   Temp:    SpO2: 100% 100%     General: Patient is hyperventilating, tearful. CV:  Good peripheral perfusion.  Resp:  Normal effort.  Abd:  No distention.  Soft with slight uterine tenderness but no rebound, no guarding. Other:     ED Results / Procedures / Treatments   Labs (all labs ordered are listed, but only abnormal results are displayed) Labs Reviewed  URINALYSIS, ROUTINE W REFLEX MICROSCOPIC - Abnormal; Notable for the following components:      Result Value   Color,  Urine YELLOW (*)    APPearance HAZY (*)    Hgb urine dipstick LARGE (*)    Protein, ur 100 (*)    Leukocytes,Ua SMALL (*)    Bacteria, UA FEW (*)    All other components within normal limits  URINE CULTURE  POC URINE PREG, ED    PROCEDURES:  Critical Care performed: No  Procedures   MEDICATIONS ORDERED IN ED: Medications  cephALEXin  (KEFLEX ) capsule 500 mg (has no administration in time range)  acetaminophen  (TYLENOL ) tablet 1,000 mg (1,000 mg Oral Given 04/12/24 0139)  ketorolac  (TORADOL ) 30 MG/ML injection 30 mg (30 mg Intramuscular Given 04/12/24 0140)     IMPRESSION / MDM / ASSESSMENT AND PLAN / ED COURSE  I reviewed the triage vital signs and the nursing notes.   Patient's presentation is most consistent with acute presentation with potential threat to life or bodily function.   Patient comes in with pain in her vaginal area after having IUD placed  today.  Her abdominal exam is overall reassuring without any rebound or guarding.  Patient is clearly very upset hyperventilating, crying, saying that she needs to have the IUD removed repeatidly.  I was able to perform a pelvic exam where patient had normal cervix with IUD strings seen coming out of it.  NO discharge. I was able to discuss patient further that sometimes after having an IUD placed it can cause some discomfort and getting used to and that could be some cramping related to some of the irritation of having the IUD put in.  She then decided that she was okay with not having the IUD take it out and wanted to try some Tylenol , Toradol  to assess symptoms afterwards and to decide on the next steps.  Patient's urine does look concerning for potential UTI.  Will send for culture.  She reports dysuria. Pregnancy test was negative.  She denies any flank pain to suggest pyelonephritis or kidney stone.  Repeat abdominal exam remains soft and nontender.  We discussed doing a transvaginal ultrasound to evaluate for any type of  misplacement of the IUD but given the strings this seems less likely and patient did not want to undergo transvaginal ultrasound which I think is reasonable given I suspect a lot of this is just related to the IUD being placed.  I considered the possibility of an endometritis but this seems less likely given the IUD was just placed less then 20 hours ago, without any fever, and no discharge noted on examination.  We discussed STD screening but patient is here with her husband and denies any concerns for new partners and declined.  We did discuss following up with her OB/GYN team and returning if she develops worsening pain, fevers or any other concerns but at this time I suspect that a lot of this is related to the IUD being placed.  When I went to reevaluate her and discuss if she wanted to have the IUD take it out patient had calm down significantly and stated that she feels like she was just not told about the side effects that she could have had from the IUD and its placement and now that she has calmed down she would like to keep it in.   I did after patient was discharged  I did run the case by Delon coe from [facility].  Although it would be rare this soon post IUD given her presentation it is reasonable to just treat her for the UTI and for her to follow-up with her OB/GYN to see if the antibiotic will need to be changed.  She did state that typically they will change it to doxycycline.  I did call the patient and her voicemail was full therefore I did call the husband and left a voicemail explaining the possibility of endometritis although this seems less likely at this time that she should follow-up for recheck tomorrow by OB and if her symptoms are not improving on the antibiotic for UTI then she may need to have it changed to cover this.     FINAL CLINICAL IMPRESSION(S) / ED DIAGNOSES   Final diagnoses:  IUD check up  Acute cystitis without hematuria     Rx / DC Orders   ED Discharge Orders      None        Note:  This document was prepared using Dragon voice recognition software and may include unintentional dictation errors.   Ernest Ronal BRAVO, MD 04/12/24 [phone]

## 2024-04-12 NOTE — ED Notes (Signed)
 ED Pregnancy test resulted negative.

## 2024-04-14 LAB — URINE CULTURE: Culture: 10000 — AB
